# Patient Record
Sex: Male | Born: 1935 | Race: White | Hispanic: No | Marital: Single | Smoking: Former smoker
Health system: Southern US, Community
[De-identification: ages and names within clinical notes are randomized; demographics above are authoritative.]

## PROBLEM LIST (undated history)

## (undated) DIAGNOSIS — M1990 Unspecified osteoarthritis, unspecified site: Secondary | ICD-10-CM

## (undated) DIAGNOSIS — Z9989 Dependence on other enabling machines and devices: Secondary | ICD-10-CM

## (undated) DIAGNOSIS — D099 Carcinoma in situ, unspecified: Secondary | ICD-10-CM

## (undated) DIAGNOSIS — Z9189 Other specified personal risk factors, not elsewhere classified: Secondary | ICD-10-CM

## (undated) DIAGNOSIS — D039 Melanoma in situ, unspecified: Secondary | ICD-10-CM

## (undated) DIAGNOSIS — D229 Melanocytic nevi, unspecified: Secondary | ICD-10-CM

## (undated) DIAGNOSIS — C4492 Squamous cell carcinoma of skin, unspecified: Secondary | ICD-10-CM

## (undated) DIAGNOSIS — R51 Headache: Secondary | ICD-10-CM

## (undated) DIAGNOSIS — N400 Enlarged prostate without lower urinary tract symptoms: Secondary | ICD-10-CM

## (undated) DIAGNOSIS — C4491 Basal cell carcinoma of skin, unspecified: Secondary | ICD-10-CM

## (undated) DIAGNOSIS — I4892 Unspecified atrial flutter: Secondary | ICD-10-CM

## (undated) DIAGNOSIS — Z87898 Personal history of other specified conditions: Secondary | ICD-10-CM

## (undated) DIAGNOSIS — R310 Gross hematuria: Secondary | ICD-10-CM

## (undated) DIAGNOSIS — I10 Essential (primary) hypertension: Secondary | ICD-10-CM

## (undated) DIAGNOSIS — Z85828 Personal history of other malignant neoplasm of skin: Secondary | ICD-10-CM

## (undated) DIAGNOSIS — K219 Gastro-esophageal reflux disease without esophagitis: Secondary | ICD-10-CM

## (undated) HISTORY — DX: Carcinoma in situ, unspecified: D09.9

## (undated) HISTORY — DX: Basal cell carcinoma of skin, unspecified: C44.91

## (undated) HISTORY — PX: PILONIDAL CYST EXCISION: SHX744

## (undated) HISTORY — DX: Squamous cell carcinoma of skin, unspecified: C44.92

## (undated) HISTORY — DX: Melanoma in situ, unspecified: D03.9

## (undated) HISTORY — DX: Essential (primary) hypertension: I10

## (undated) HISTORY — DX: Melanocytic nevi, unspecified: D22.9

## (undated) HISTORY — PX: TONSILLECTOMY: SUR1361

## (undated) HISTORY — DX: Unspecified osteoarthritis, unspecified site: M19.90

---

## 1992-05-17 HISTORY — PX: HERNIA REPAIR: SHX51

## 1998-06-02 ENCOUNTER — Encounter: Payer: Self-pay | Admitting: Emergency Medicine

## 1998-06-02 ENCOUNTER — Inpatient Hospital Stay (HOSPITAL_COMMUNITY): Admission: EM | Admit: 1998-06-02 | Discharge: 1998-06-03 | Payer: Self-pay | Admitting: Emergency Medicine

## 1998-06-03 ENCOUNTER — Encounter: Payer: Self-pay | Admitting: *Deleted

## 2000-08-31 DIAGNOSIS — D099 Carcinoma in situ, unspecified: Secondary | ICD-10-CM

## 2000-08-31 HISTORY — DX: Carcinoma in situ, unspecified: D09.9

## 2003-03-12 DIAGNOSIS — C4492 Squamous cell carcinoma of skin, unspecified: Secondary | ICD-10-CM

## 2003-03-12 HISTORY — DX: Squamous cell carcinoma of skin, unspecified: C44.92

## 2007-08-04 ENCOUNTER — Ambulatory Visit: Payer: Self-pay | Admitting: Internal Medicine

## 2007-08-21 ENCOUNTER — Ambulatory Visit: Payer: Self-pay | Admitting: Internal Medicine

## 2009-11-24 DIAGNOSIS — D039 Melanoma in situ, unspecified: Secondary | ICD-10-CM

## 2009-11-24 DIAGNOSIS — D229 Melanocytic nevi, unspecified: Secondary | ICD-10-CM

## 2009-11-24 DIAGNOSIS — D099 Carcinoma in situ, unspecified: Secondary | ICD-10-CM

## 2009-11-24 DIAGNOSIS — C4491 Basal cell carcinoma of skin, unspecified: Secondary | ICD-10-CM

## 2009-11-24 HISTORY — DX: Carcinoma in situ, unspecified: D09.9

## 2009-11-24 HISTORY — DX: Melanoma in situ, unspecified: D03.9

## 2009-11-24 HISTORY — DX: Melanocytic nevi, unspecified: D22.9

## 2009-11-24 HISTORY — DX: Basal cell carcinoma of skin, unspecified: C44.91

## 2010-09-29 NOTE — Assessment & Plan Note (Signed)
[facility]                         GASTROENTEROLOGY OFFICE NOTE   NAME:Stephen Choi, Stephen Choi                    MRN:          [id number]  DATE:08/04/2007                            DOB:          [birth year]    REFERRING PHYSICIAN:  Geoffry Paradise, M.D.   REASON FOR CONSULTATION:  Hemoccult positive stool.   HISTORY:  This is a pleasant 77 year old white male with a history of  benign prostatic hypertrophy and osteoarthritis.  He is referred through  the courtesy of Dr. Geoffry Paradise regarding Hemoccult positive stool.  The patient underwent his annual physical exam earlier this year.  Hemoglobin on May 31, 2007, was normal at 16.7.  Hemasure studies  submitted on June 26, 2007, positive.   The patient's GI review of systems is negative.  He denies heartburn,  dysphagia, abdominal pain, change in bowel habits, melena or  hematochezia.  He does use Diclofenac for his arthritis.  The patient  states he thinks he had a colonoscopy over five years ago, though is not  certain with whom or where.  No records found in this facility.   PAST MEDICAL HISTORY:  As above.   PAST SURGICAL HISTORY:  None.   ALLERGIES:  No known drug allergies.   CURRENT MEDICATIONS:  1. Flomax 0.4 mg daily.  2. Diclofenac 50 mg daily.   FAMILY HISTORY:  No family history of gastrointestinal malignancy.   SOCIAL HISTORY:  The patient is married with two children.  He has a  Master's degree.  He is retired Cabin [employer].  He does not smoke.  He does use  alcohol.   REVIEW OF SYSTEMS:  Per the diagnostic evaluation form.   PHYSICAL EXAMINATION:  GENERAL:  A well-appearing male, in no acute  distress.  VITAL SIGNS:  Blood pressure 140/78, heart rate 68, weight 223.4 pounds,  height 5 feet 10 inches.  HEENT:  Sclerae anicteric.  Conjunctivae pink.  Oral mucosa intact.  NECK:  No adenopathy.  LUNGS:  Clear.  HEART:  Regular.  ABDOMEN:  Soft without tenderness, mass or  hernia.  Good bowel sounds  heard.  EXTREMITIES:  Without edema.   IMPRESSION:  A 77 year old gentleman who presents regarding Hemoccult  positive stool.  Rule out colonic mucosal pathology such as neoplasia.  Rule out nonsteroidal anti-inflammatory drug-induced lesion.   RECOMMENDATIONS:  Colonoscopy and upper endoscopy.  The nature of the  procedure, as well as the risks, benefits and alternatives were  discussed in detail.  He understood and agreed to proceed.     Wilhemina Bonito. Marina Goodell, MD  Electronically Signed    JNP/MedQ  DD: 08/08/2007  DT: 08/08/2007  Job #: [id number]   cc:   Geoffry Paradise, M.D.

## 2011-05-18 HISTORY — PX: CATARACT EXTRACTION W/ INTRAOCULAR LENS  IMPLANT, BILATERAL: SHX1307

## 2011-06-14 DIAGNOSIS — D0439 Carcinoma in situ of skin of other parts of face: Secondary | ICD-10-CM | POA: Diagnosis not present

## 2011-07-05 DIAGNOSIS — E785 Hyperlipidemia, unspecified: Secondary | ICD-10-CM | POA: Diagnosis not present

## 2011-07-05 DIAGNOSIS — R7301 Impaired fasting glucose: Secondary | ICD-10-CM | POA: Diagnosis not present

## 2011-07-05 DIAGNOSIS — Z125 Encounter for screening for malignant neoplasm of prostate: Secondary | ICD-10-CM | POA: Diagnosis not present

## 2011-07-12 DIAGNOSIS — Z125 Encounter for screening for malignant neoplasm of prostate: Secondary | ICD-10-CM | POA: Diagnosis not present

## 2011-07-12 DIAGNOSIS — G43809 Other migraine, not intractable, without status migrainosus: Secondary | ICD-10-CM | POA: Diagnosis not present

## 2011-07-12 DIAGNOSIS — Z0000 Encounter for general adult medical examination without abnormal findings: Secondary | ICD-10-CM | POA: Diagnosis not present

## 2011-07-12 DIAGNOSIS — R7301 Impaired fasting glucose: Secondary | ICD-10-CM | POA: Diagnosis not present

## 2011-07-12 DIAGNOSIS — E785 Hyperlipidemia, unspecified: Secondary | ICD-10-CM | POA: Diagnosis not present

## 2011-07-13 DIAGNOSIS — I658 Occlusion and stenosis of other precerebral arteries: Secondary | ICD-10-CM | POA: Diagnosis not present

## 2011-07-13 DIAGNOSIS — R0989 Other specified symptoms and signs involving the circulatory and respiratory systems: Secondary | ICD-10-CM | POA: Diagnosis not present

## 2011-07-13 DIAGNOSIS — Z1212 Encounter for screening for malignant neoplasm of rectum: Secondary | ICD-10-CM | POA: Diagnosis not present

## 2011-09-20 DIAGNOSIS — L570 Actinic keratosis: Secondary | ICD-10-CM | POA: Diagnosis not present

## 2011-09-24 DIAGNOSIS — M1990 Unspecified osteoarthritis, unspecified site: Secondary | ICD-10-CM | POA: Diagnosis not present

## 2011-09-24 DIAGNOSIS — M5380 Other specified dorsopathies, site unspecified: Secondary | ICD-10-CM | POA: Diagnosis not present

## 2011-09-24 DIAGNOSIS — M4800 Spinal stenosis, site unspecified: Secondary | ICD-10-CM | POA: Diagnosis not present

## 2011-11-01 DIAGNOSIS — H04129 Dry eye syndrome of unspecified lacrimal gland: Secondary | ICD-10-CM | POA: Diagnosis not present

## 2011-11-01 DIAGNOSIS — H02839 Dermatochalasis of unspecified eye, unspecified eyelid: Secondary | ICD-10-CM | POA: Diagnosis not present

## 2011-11-01 DIAGNOSIS — L719 Rosacea, unspecified: Secondary | ICD-10-CM | POA: Diagnosis not present

## 2011-11-01 DIAGNOSIS — H40019 Open angle with borderline findings, low risk, unspecified eye: Secondary | ICD-10-CM | POA: Diagnosis not present

## 2011-12-09 DIAGNOSIS — L570 Actinic keratosis: Secondary | ICD-10-CM | POA: Diagnosis not present

## 2011-12-09 DIAGNOSIS — D485 Neoplasm of uncertain behavior of skin: Secondary | ICD-10-CM | POA: Diagnosis not present

## 2011-12-09 DIAGNOSIS — L723 Sebaceous cyst: Secondary | ICD-10-CM | POA: Diagnosis not present

## 2012-01-10 DIAGNOSIS — R7301 Impaired fasting glucose: Secondary | ICD-10-CM | POA: Diagnosis not present

## 2012-01-10 DIAGNOSIS — R5381 Other malaise: Secondary | ICD-10-CM | POA: Diagnosis not present

## 2012-01-10 DIAGNOSIS — R5383 Other fatigue: Secondary | ICD-10-CM | POA: Diagnosis not present

## 2012-01-10 DIAGNOSIS — E785 Hyperlipidemia, unspecified: Secondary | ICD-10-CM | POA: Diagnosis not present

## 2012-01-10 DIAGNOSIS — M4800 Spinal stenosis, site unspecified: Secondary | ICD-10-CM | POA: Diagnosis not present

## 2012-01-13 DIAGNOSIS — E785 Hyperlipidemia, unspecified: Secondary | ICD-10-CM | POA: Diagnosis not present

## 2012-01-21 DIAGNOSIS — R609 Edema, unspecified: Secondary | ICD-10-CM | POA: Diagnosis not present

## 2012-02-24 DIAGNOSIS — Z23 Encounter for immunization: Secondary | ICD-10-CM | POA: Diagnosis not present

## 2012-04-26 DIAGNOSIS — M4800 Spinal stenosis, site unspecified: Secondary | ICD-10-CM | POA: Diagnosis not present

## 2012-04-26 DIAGNOSIS — M5380 Other specified dorsopathies, site unspecified: Secondary | ICD-10-CM | POA: Diagnosis not present

## 2012-04-26 DIAGNOSIS — M1990 Unspecified osteoarthritis, unspecified site: Secondary | ICD-10-CM | POA: Diagnosis not present

## 2012-05-04 DIAGNOSIS — H40019 Open angle with borderline findings, low risk, unspecified eye: Secondary | ICD-10-CM | POA: Diagnosis not present

## 2012-05-04 DIAGNOSIS — H35039 Hypertensive retinopathy, unspecified eye: Secondary | ICD-10-CM | POA: Diagnosis not present

## 2012-05-04 DIAGNOSIS — H04129 Dry eye syndrome of unspecified lacrimal gland: Secondary | ICD-10-CM | POA: Diagnosis not present

## 2012-05-04 DIAGNOSIS — L719 Rosacea, unspecified: Secondary | ICD-10-CM | POA: Diagnosis not present

## 2012-05-04 DIAGNOSIS — H43399 Other vitreous opacities, unspecified eye: Secondary | ICD-10-CM | POA: Diagnosis not present

## 2012-05-04 DIAGNOSIS — Z961 Presence of intraocular lens: Secondary | ICD-10-CM | POA: Diagnosis not present

## 2012-05-17 DIAGNOSIS — I10 Essential (primary) hypertension: Secondary | ICD-10-CM

## 2012-05-17 HISTORY — DX: Essential (primary) hypertension: I10

## 2012-06-26 DIAGNOSIS — I10 Essential (primary) hypertension: Secondary | ICD-10-CM | POA: Diagnosis not present

## 2012-06-26 DIAGNOSIS — I4891 Unspecified atrial fibrillation: Secondary | ICD-10-CM | POA: Diagnosis not present

## 2012-06-26 DIAGNOSIS — M4800 Spinal stenosis, site unspecified: Secondary | ICD-10-CM | POA: Diagnosis not present

## 2012-06-26 DIAGNOSIS — E785 Hyperlipidemia, unspecified: Secondary | ICD-10-CM | POA: Diagnosis not present

## 2012-07-10 DIAGNOSIS — E785 Hyperlipidemia, unspecified: Secondary | ICD-10-CM | POA: Diagnosis not present

## 2012-07-10 DIAGNOSIS — I10 Essential (primary) hypertension: Secondary | ICD-10-CM | POA: Diagnosis not present

## 2012-07-10 DIAGNOSIS — R7301 Impaired fasting glucose: Secondary | ICD-10-CM | POA: Diagnosis not present

## 2012-07-10 DIAGNOSIS — Z125 Encounter for screening for malignant neoplasm of prostate: Secondary | ICD-10-CM | POA: Diagnosis not present

## 2012-07-11 DIAGNOSIS — M545 Low back pain: Secondary | ICD-10-CM | POA: Diagnosis not present

## 2012-07-12 ENCOUNTER — Ambulatory Visit (INDEPENDENT_AMBULATORY_CARE_PROVIDER_SITE_OTHER): Payer: Medicare Other | Admitting: Cardiovascular Disease

## 2012-07-12 ENCOUNTER — Encounter: Payer: Self-pay | Admitting: Cardiovascular Disease

## 2012-07-12 VITALS — BP 156/96 | HR 74 | Ht 70.0 in | Wt 225.8 lb

## 2012-07-12 DIAGNOSIS — I4892 Unspecified atrial flutter: Secondary | ICD-10-CM | POA: Diagnosis not present

## 2012-07-12 MED ORDER — WARFARIN SODIUM 2.5 MG PO TABS: ORAL_TABLET | ORAL | Status: DC

## 2012-07-12 MED ORDER — ASPIRIN 81 MG PO TABS: 81.0000 mg | ORAL_TABLET | Freq: Every day | ORAL | Status: DC

## 2012-07-12 NOTE — Assessment & Plan Note (Addendum)
Stephen Choi presents today with incidentally noted atrial flutter.  He has no symptoms of shortness of breath or chest pain. He is able to do all of his normal activities. He works out at [employer] on regular basis.  His rate is well controlled on the current dose of diltiazem. I think at all we need to do is at Coumadin. Since he has atrial flutter he's not a candidate for one of the newer agents. We'll go ahead and start him on Coumadin today. He should be check his INR levels at the [facility]. Alternatively, we can have his levels checked in our Coumadin clinic. I'll see him back in one year for followup visit and EKG.  At this point he's not having any episodes of chest pain or shortness breath. I do not think that his symptoms sound like ischemic heart disease. I do not think that he needs an exercise test at this time.

## 2012-07-12 NOTE — Patient Instructions (Addendum)
Your physician wants you to follow-up in: 1 year  You will receive a reminder letter in the mail two months in advance. If you don't receive a letter, please call our office to schedule the follow-up appointment.  Your physician has recommended you make the following change in your medication:   Start coumadin/ warfarin, 2.5 mg tablet, on Monday Wednesday and Friday take 5 mg (2 - 2.5 mg tablet) On Tuesday Thursday Saturday and Sunday take 2.5 mg.  You are to follow up in 1 week for lab draw. Sheryle Spray pharm d council pt completed. Reduce aspirin to 81 mg daily.

## 2012-07-12 NOTE — Progress Notes (Signed)
     Stephen Choi Date of Birth  12/24/1927       [facility]    [place] [address]  [address] [place], [place]  [address]   [place], [place]  [address] [phone]     [phone]   Fax  [phone]    Fax [phone]  Problem List: 1. Hypertension 2. Atrial Flutter 3. Elevated PSA - biopsy negative for cancer in the past.   History of Present Illness:  Stephen Choi is an 79 year old gentleman who is referred here for further evaluation of atrial flutter. He did not know that he had any heartbeat  irregularities.  He works out on a regular basis without symptoms.  He denies any chest pain or shortness of breath. He denies any syncope or presyncope.  He was diagnosed with hypertension several weeks ago and has just recently been started on some new medications.  No current outpatient prescriptions on file prior to visit.   No current facility-administered medications on file prior to visit.   Current Outpatient Prescriptions  Medication Sig Dispense Refill  . aspirin 325 MG tablet Take 325 mg by mouth daily.      . diclofenac (VOLTAREN) 75 MG EC tablet Take 75 mg by mouth 2 (two) times daily.      Marland Kitchen diltiazem (DILACOR XR) 120 MG 24 hr capsule Take 120 mg by mouth daily.      . Glucosamine-Chondroitin (GLUCOSAMINE CHONDR COMPLEX PO) Take by mouth daily.      . hydrochlorothiazide (HYDRODIURIL) 25 MG tablet Take 25 mg by mouth daily.      . methocarbamol (ROBAXIN) 500 MG tablet Take 500 mg by mouth 3 (three) times daily as needed.      . Saw Palmetto 450 MG CAPS Take 450 mg by mouth 2 (two) times daily.      . tamsulosin (FLOMAX) 0.4 MG CAPS Take 0.4 mg by mouth daily after supper.       No current facility-administered medications for this visit.      No Known Allergies  Past Medical History  Diagnosis Date  . Hypertension 2014  . Arthritis     Past Surgical History  Procedure Laterality Date  . Cystectomy     lower back  . Hernia repair      History  Smoking status  . Former Smoker  Smokeless tobacco  . Not on file    History  Alcohol Use  . Yes    History reviewed. No pertinent family history.  Reviw of Systems:  Reviewed in the HPI.  All other systems are negative.  Physical Exam: Blood pressure 156/96, pulse 74, height 5\' 10"  (1.778 m), weight 225 lb 12.8 oz (102.422 kg). General: Well developed, well nourished, in no acute distress.  Head: Normocephalic, atraumatic, sclera non-icteric, mucus membranes are moist,   Neck: Supple. Carotids are 2 + without bruits. No JVD   Lungs: Clear   Heart: RR, normal S1, S2  Abdomen: Soft, non-tender, non-distended with normal bowel sounds.  Msk:  Strength and tone are normal   Extremities: No clubbing or cyanosis. No edema.  Distal pedal pulses are 2+ and equal    Neuro: CN II - XII intact.  Alert and oriented X 3.   Psych:  Normal   ECG: Atrial flutter with a ventricular rate of 74. He has no ST or T wave changes.  Assessment / Plan:

## 2012-07-14 DIAGNOSIS — M545 Low back pain: Secondary | ICD-10-CM | POA: Diagnosis not present

## 2012-07-17 DIAGNOSIS — I10 Essential (primary) hypertension: Secondary | ICD-10-CM | POA: Diagnosis not present

## 2012-07-17 DIAGNOSIS — E785 Hyperlipidemia, unspecified: Secondary | ICD-10-CM | POA: Diagnosis not present

## 2012-07-17 DIAGNOSIS — Z0000 Encounter for general adult medical examination without abnormal findings: Secondary | ICD-10-CM | POA: Diagnosis not present

## 2012-07-17 DIAGNOSIS — I4891 Unspecified atrial fibrillation: Secondary | ICD-10-CM | POA: Diagnosis not present

## 2012-07-18 DIAGNOSIS — Z1212 Encounter for screening for malignant neoplasm of rectum: Secondary | ICD-10-CM | POA: Diagnosis not present

## 2012-07-19 DIAGNOSIS — M545 Low back pain: Secondary | ICD-10-CM | POA: Diagnosis not present

## 2012-07-19 DIAGNOSIS — I4891 Unspecified atrial fibrillation: Secondary | ICD-10-CM | POA: Diagnosis not present

## 2012-07-19 DIAGNOSIS — Z7901 Long term (current) use of anticoagulants: Secondary | ICD-10-CM | POA: Diagnosis not present

## 2012-07-24 DIAGNOSIS — L570 Actinic keratosis: Secondary | ICD-10-CM | POA: Diagnosis not present

## 2012-07-27 DIAGNOSIS — M545 Low back pain: Secondary | ICD-10-CM | POA: Diagnosis not present

## 2012-08-02 DIAGNOSIS — Z7901 Long term (current) use of anticoagulants: Secondary | ICD-10-CM | POA: Diagnosis not present

## 2012-08-02 DIAGNOSIS — I4891 Unspecified atrial fibrillation: Secondary | ICD-10-CM | POA: Diagnosis not present

## 2012-08-14 DIAGNOSIS — M48061 Spinal stenosis, lumbar region without neurogenic claudication: Secondary | ICD-10-CM | POA: Diagnosis not present

## 2012-08-15 DIAGNOSIS — Z7901 Long term (current) use of anticoagulants: Secondary | ICD-10-CM | POA: Diagnosis not present

## 2012-08-15 DIAGNOSIS — M545 Low back pain: Secondary | ICD-10-CM | POA: Diagnosis not present

## 2012-08-15 DIAGNOSIS — I4891 Unspecified atrial fibrillation: Secondary | ICD-10-CM | POA: Diagnosis not present

## 2012-08-17 DIAGNOSIS — M545 Low back pain: Secondary | ICD-10-CM | POA: Diagnosis not present

## 2012-08-19 DIAGNOSIS — M47817 Spondylosis without myelopathy or radiculopathy, lumbosacral region: Secondary | ICD-10-CM | POA: Diagnosis not present

## 2012-08-21 DIAGNOSIS — M48061 Spinal stenosis, lumbar region without neurogenic claudication: Secondary | ICD-10-CM | POA: Diagnosis not present

## 2012-08-25 ENCOUNTER — Telehealth: Payer: Self-pay | Admitting: Internal Medicine

## 2012-08-25 NOTE — Telephone Encounter (Signed)
Rec'd from [facility] forward 1 page to Dr.Perry 08/25/12

## 2012-08-28 ENCOUNTER — Other Ambulatory Visit: Payer: Self-pay | Admitting: Internal Medicine

## 2012-08-28 DIAGNOSIS — N281 Cyst of kidney, acquired: Secondary | ICD-10-CM

## 2012-08-28 DIAGNOSIS — M4800 Spinal stenosis, site unspecified: Secondary | ICD-10-CM | POA: Diagnosis not present

## 2012-08-28 DIAGNOSIS — Q619 Cystic kidney disease, unspecified: Secondary | ICD-10-CM | POA: Diagnosis not present

## 2012-08-29 ENCOUNTER — Ambulatory Visit
Admission: RE | Admit: 2012-08-29 | Discharge: 2012-08-29 | Disposition: A | Discharge status: Home / Self Care | Payer: Medicare Other | Source: Ambulatory Visit | Attending: Internal Medicine | Admitting: Internal Medicine

## 2012-08-29 DIAGNOSIS — N281 Cyst of kidney, acquired: Secondary | ICD-10-CM | POA: Diagnosis not present

## 2012-08-30 ENCOUNTER — Other Ambulatory Visit: Payer: Self-pay | Admitting: Internal Medicine

## 2012-08-30 DIAGNOSIS — I4891 Unspecified atrial fibrillation: Secondary | ICD-10-CM | POA: Diagnosis not present

## 2012-08-30 DIAGNOSIS — N281 Cyst of kidney, acquired: Secondary | ICD-10-CM

## 2012-08-30 DIAGNOSIS — Z7901 Long term (current) use of anticoagulants: Secondary | ICD-10-CM | POA: Diagnosis not present

## 2012-09-01 ENCOUNTER — Ambulatory Visit
Admission: RE | Admit: 2012-09-01 | Discharge: 2012-09-01 | Disposition: A | Discharge status: Home / Self Care | Payer: Medicare Other | Source: Ambulatory Visit | Attending: Internal Medicine | Admitting: Internal Medicine

## 2012-09-01 DIAGNOSIS — N200 Calculus of kidney: Secondary | ICD-10-CM | POA: Diagnosis not present

## 2012-09-01 DIAGNOSIS — N281 Cyst of kidney, acquired: Secondary | ICD-10-CM

## 2012-09-01 MED ORDER — IOHEXOL 300 MG/ML  SOLN
125.0000 mL | Freq: Once | INTRAMUSCULAR | Status: AC | PRN
  Administered 2012-09-01: 125 mL via INTRAVENOUS

## 2012-09-06 DIAGNOSIS — Z7901 Long term (current) use of anticoagulants: Secondary | ICD-10-CM | POA: Diagnosis not present

## 2012-09-06 DIAGNOSIS — I4891 Unspecified atrial fibrillation: Secondary | ICD-10-CM | POA: Diagnosis not present

## 2012-09-07 DIAGNOSIS — M48061 Spinal stenosis, lumbar region without neurogenic claudication: Secondary | ICD-10-CM | POA: Diagnosis not present

## 2012-09-28 DIAGNOSIS — I4891 Unspecified atrial fibrillation: Secondary | ICD-10-CM | POA: Diagnosis not present

## 2012-09-28 DIAGNOSIS — Z7901 Long term (current) use of anticoagulants: Secondary | ICD-10-CM | POA: Diagnosis not present

## 2012-10-02 DIAGNOSIS — M545 Low back pain: Secondary | ICD-10-CM | POA: Diagnosis not present

## 2012-10-02 DIAGNOSIS — M48061 Spinal stenosis, lumbar region without neurogenic claudication: Secondary | ICD-10-CM | POA: Diagnosis not present

## 2012-10-06 DIAGNOSIS — M545 Low back pain: Secondary | ICD-10-CM | POA: Diagnosis not present

## 2012-10-10 DIAGNOSIS — M48061 Spinal stenosis, lumbar region without neurogenic claudication: Secondary | ICD-10-CM | POA: Diagnosis not present

## 2012-10-11 DIAGNOSIS — M545 Low back pain: Secondary | ICD-10-CM | POA: Diagnosis not present

## 2012-10-13 DIAGNOSIS — M545 Low back pain: Secondary | ICD-10-CM | POA: Diagnosis not present

## 2012-10-16 DIAGNOSIS — M545 Low back pain: Secondary | ICD-10-CM | POA: Diagnosis not present

## 2012-10-18 DIAGNOSIS — M4800 Spinal stenosis, site unspecified: Secondary | ICD-10-CM | POA: Diagnosis not present

## 2012-10-18 DIAGNOSIS — I10 Essential (primary) hypertension: Secondary | ICD-10-CM | POA: Diagnosis not present

## 2012-10-18 DIAGNOSIS — I4891 Unspecified atrial fibrillation: Secondary | ICD-10-CM | POA: Diagnosis not present

## 2012-10-19 DIAGNOSIS — M545 Low back pain: Secondary | ICD-10-CM | POA: Diagnosis not present

## 2012-10-23 DIAGNOSIS — M545 Low back pain: Secondary | ICD-10-CM | POA: Diagnosis not present

## 2012-10-25 DIAGNOSIS — M545 Low back pain: Secondary | ICD-10-CM | POA: Diagnosis not present

## 2012-10-26 DIAGNOSIS — M48061 Spinal stenosis, lumbar region without neurogenic claudication: Secondary | ICD-10-CM | POA: Diagnosis not present

## 2012-10-26 DIAGNOSIS — IMO0002 Reserved for concepts with insufficient information to code with codable children: Secondary | ICD-10-CM | POA: Diagnosis not present

## 2012-10-30 DIAGNOSIS — M545 Low back pain: Secondary | ICD-10-CM | POA: Diagnosis not present

## 2012-11-02 DIAGNOSIS — M545 Low back pain: Secondary | ICD-10-CM | POA: Diagnosis not present

## 2012-11-06 DIAGNOSIS — M545 Low back pain: Secondary | ICD-10-CM | POA: Diagnosis not present

## 2012-11-09 DIAGNOSIS — M545 Low back pain: Secondary | ICD-10-CM | POA: Diagnosis not present

## 2012-11-13 DIAGNOSIS — M48061 Spinal stenosis, lumbar region without neurogenic claudication: Secondary | ICD-10-CM | POA: Diagnosis not present

## 2012-11-14 DIAGNOSIS — M545 Low back pain: Secondary | ICD-10-CM | POA: Diagnosis not present

## 2012-11-16 DIAGNOSIS — M545 Low back pain: Secondary | ICD-10-CM | POA: Diagnosis not present

## 2012-11-20 DIAGNOSIS — M545 Low back pain: Secondary | ICD-10-CM | POA: Diagnosis not present

## 2012-11-23 DIAGNOSIS — M545 Low back pain: Secondary | ICD-10-CM | POA: Diagnosis not present

## 2012-11-27 DIAGNOSIS — M545 Low back pain: Secondary | ICD-10-CM | POA: Diagnosis not present

## 2012-11-30 DIAGNOSIS — M545 Low back pain: Secondary | ICD-10-CM | POA: Diagnosis not present

## 2013-01-08 DIAGNOSIS — M48061 Spinal stenosis, lumbar region without neurogenic claudication: Secondary | ICD-10-CM | POA: Diagnosis not present

## 2013-01-19 DIAGNOSIS — M48061 Spinal stenosis, lumbar region without neurogenic claudication: Secondary | ICD-10-CM | POA: Diagnosis not present

## 2013-01-26 DIAGNOSIS — M48061 Spinal stenosis, lumbar region without neurogenic claudication: Secondary | ICD-10-CM | POA: Diagnosis not present

## 2013-01-29 ENCOUNTER — Other Ambulatory Visit: Payer: Self-pay | Admitting: Orthopedic Surgery

## 2013-01-31 ENCOUNTER — Encounter (HOSPITAL_COMMUNITY): Payer: Self-pay | Admitting: Pharmacy Technician

## 2013-02-05 ENCOUNTER — Encounter (HOSPITAL_COMMUNITY)
Admission: RE | Admit: 2013-02-05 | Discharge: 2013-02-05 | Disposition: A | Discharge status: Home / Self Care | Payer: Medicare Other | Source: Ambulatory Visit | Attending: Orthopedic Surgery | Admitting: Orthopedic Surgery

## 2013-02-05 ENCOUNTER — Encounter (HOSPITAL_COMMUNITY): Payer: Self-pay

## 2013-02-05 DIAGNOSIS — Z01811 Encounter for preprocedural respiratory examination: Secondary | ICD-10-CM | POA: Diagnosis not present

## 2013-02-05 DIAGNOSIS — M48062 Spinal stenosis, lumbar region with neurogenic claudication: Secondary | ICD-10-CM | POA: Diagnosis not present

## 2013-02-05 LAB — COMPREHENSIVE METABOLIC PANEL
ALT: 25 U/L (ref 0–53)
CO2: 26 mEq/L (ref 19–32)
Calcium: 9 mg/dL (ref 8.4–10.5)
Creatinine, Ser: 0.95 mg/dL (ref 0.50–1.35)
GFR calc Af Amer: 87 mL/min — ABNORMAL LOW (ref >90)
GFR calc non Af Amer: 75 mL/min — ABNORMAL LOW (ref >90)
Glucose, Bld: 98 mg/dL (ref 70–99)
Sodium: 138 mEq/L (ref 135–145)
Total Protein: 7.1 g/dL (ref 6.0–8.3)

## 2013-02-05 LAB — TYPE AND SCREEN
ABO/RH(D): A POS
Antibody Screen: NEGATIVE

## 2013-02-05 LAB — SURGICAL PCR SCREEN: MRSA, PCR: NEGATIVE

## 2013-02-05 LAB — CBC WITH DIFFERENTIAL/PLATELET
Eosinophils Absolute: 0.4 10*3/uL (ref 0.0–0.7)
Eosinophils Relative: 7 % — ABNORMAL HIGH (ref 0–5)
HCT: 45.4 % (ref 39.0–52.0)
Lymphocytes Relative: 18 % (ref 12–46)
Lymphs Abs: 1.2 10*3/uL (ref 0.7–4.0)
MCH: 37.9 pg — ABNORMAL HIGH (ref 26.0–34.0)
MCV: 106.1 fL — ABNORMAL HIGH (ref 78.0–100.0)
Monocytes Absolute: 0.9 10*3/uL (ref 0.1–1.0)
Platelets: 238 10*3/uL (ref 150–400)
RBC: 4.28 MIL/uL (ref 4.22–5.81)
RDW: 13.1 % (ref 11.5–15.5)

## 2013-02-05 LAB — URINALYSIS, ROUTINE W REFLEX MICROSCOPIC
Bilirubin Urine: NEGATIVE
Glucose, UA: NEGATIVE mg/dL
Hgb urine dipstick: NEGATIVE
Ketones, ur: NEGATIVE mg/dL
Protein, ur: NEGATIVE mg/dL
Urobilinogen, UA: 1 mg/dL (ref 0.0–1.0)

## 2013-02-05 LAB — PROTIME-INR
INR: 1.04 (ref 0.00–1.49)
Prothrombin Time: 13.4 seconds (ref 11.6–15.2)

## 2013-02-05 LAB — ABO/RH: ABO/RH(D): A POS

## 2013-02-05 NOTE — Progress Notes (Signed)
Patient had stopped all meds except saw palmetto and glucosamine.said he was instructed to do so. Told him to take his flomax, diltiazem ,and hctz today and tomorrow and take diltiazem and flomax am of surgery.

## 2013-02-05 NOTE — Pre-Procedure Instructions (Addendum)
Stephen Choi  02/05/2013   Your procedure is scheduled on:  02/07/13  Report to [place] SHORT STAY ADMITTING at 1000 AM.  Call this number if you have problems the morning of surgery: [phone]   Remember:   Do not eat food or drink liquids after midnight.   Take these medicines the morning of surgery with A SIP OF WATER: DILTIAZEM,FLOMAX                   STOP SAW PALMETTO, GLUCOSAMINE-CONDRONTIN,DICLFENAC  NOW       STOP XARELTO   PER DR   Do not wear jewelry, make-up or nail polish.  Do not wear lotions, powders, or perfumes. You may wear deodorant.  Do not shave 48 hours prior to surgery. Men may shave face and neck.  Do not bring valuables to the hospital.  [facility] is not responsible                   for any belongings or valuables.  Contacts, dentures or bridgework may not be worn into surgery.  Leave suitcase in the car. After surgery it may be brought to your room.  For patients admitted to the hospital, checkout time is 11:00 AM the day of  discharge.   Patients discharged the day of surgery will not be allowed to drive  home.  Name and phone number of your driver:   Special Instructions: Incentive Spirometry - Practice and bring it with you on the day of surgery. Shower using CHG 2 nights before surgery and the night before surgery.  If you shower the day of surgery use CHG.  Use special wash - you have one bottle of CHG for all showers.  You should use approximately 1/3 of the bottle for each shower.   Please read over the following fact sheets that you were given: Pain Booklet, Coughing and Deep Breathing, Blood Transfusion Information, MRSA Information and Surgical Site Infection Prevention

## 2013-02-06 ENCOUNTER — Encounter (HOSPITAL_COMMUNITY): Payer: Self-pay

## 2013-02-06 DIAGNOSIS — Z1331 Encounter for screening for depression: Secondary | ICD-10-CM | POA: Diagnosis not present

## 2013-02-06 DIAGNOSIS — Z6833 Body mass index (BMI) 33.0-33.9, adult: Secondary | ICD-10-CM | POA: Diagnosis not present

## 2013-02-06 DIAGNOSIS — M4800 Spinal stenosis, site unspecified: Secondary | ICD-10-CM | POA: Diagnosis not present

## 2013-02-06 DIAGNOSIS — R7301 Impaired fasting glucose: Secondary | ICD-10-CM | POA: Diagnosis not present

## 2013-02-06 DIAGNOSIS — I10 Essential (primary) hypertension: Secondary | ICD-10-CM | POA: Diagnosis not present

## 2013-02-06 DIAGNOSIS — Z23 Encounter for immunization: Secondary | ICD-10-CM | POA: Diagnosis not present

## 2013-02-06 DIAGNOSIS — I4891 Unspecified atrial fibrillation: Secondary | ICD-10-CM | POA: Diagnosis not present

## 2013-02-06 MED ORDER — CEFAZOLIN SODIUM-DEXTROSE 2-3 GM-% IV SOLR
2.0000 g | INTRAVENOUS | Status: DC
  Filled 2013-02-06: qty 50

## 2013-02-06 MED ORDER — POVIDONE-IODINE 7.5 % EX SOLN
Freq: Once | CUTANEOUS | Status: DC
  Filled 2013-02-06: qty 118

## 2013-02-06 NOTE — Progress Notes (Signed)
Anesthesia Chart Review:  Patient is a 79 year old male scheduled for L3-4 X-STOP on 02/07/13 by Dr. Yevette Edwards.  History includes former smoker, HTN, atrial flutter, arthritis, tonsillectomy, hernia repair. PCP is Dr. Jacky Kindle with [facility]. Dr. Jacky Kindle saw patient on 01/31/13 and cleared him for this procedure with instructions to hold Xarelto 2 days prior to surgery.  Of note, patient was seen by cardiologist Dr. Elease Hashimoto on 07/12/12 for evaluation of new onset aflutter.  He was asymptomatic and able to work-out on a regular basis so no additional testing was felt indicated at that time.  He remains asymptomatic.  EKG on 02/05/13 showed SR with marked sinus arrhythmia.  CXR on 02/05/13 showed no active cardiopulmonary disease.  Preoperative labs noted.  Patient had with aflutter and cardiology evaluation earlier this year.  No cardiac testing was ordered.  He is now on Xarelto which has been on hold since 02/04/13. His PCP has cleared him for surgery, so if no acute changes then I would anticipate that he could proceed as planned.  Anesthesiologist Dr. Katrinka Blazing agrees with this plan.  Velna Ochs [facility] Short Stay Center/Anesthesiology Phone [phone] 02/06/2013 2:19 PM

## 2013-02-07 ENCOUNTER — Encounter (HOSPITAL_COMMUNITY): Payer: Self-pay | Admitting: *Deleted

## 2013-02-07 ENCOUNTER — Ambulatory Visit (HOSPITAL_COMMUNITY)
Admission: RE | Admit: 2013-02-07 | Discharge: 2013-02-07 | Disposition: A | Discharge status: Home / Self Care | Payer: Medicare Other | Source: Ambulatory Visit | Attending: Orthopedic Surgery | Admitting: Orthopedic Surgery

## 2013-02-07 ENCOUNTER — Encounter (HOSPITAL_COMMUNITY)
Admission: RE | Disposition: A | Discharge status: Home / Self Care | Payer: Self-pay | Source: Ambulatory Visit | Attending: Orthopedic Surgery

## 2013-02-07 ENCOUNTER — Ambulatory Visit (HOSPITAL_COMMUNITY): Payer: Medicare Other

## 2013-02-07 ENCOUNTER — Ambulatory Visit (HOSPITAL_COMMUNITY): Payer: Medicare Other | Admitting: Anesthesiology

## 2013-02-07 ENCOUNTER — Encounter (HOSPITAL_COMMUNITY): Payer: Self-pay | Admitting: Vascular Surgery

## 2013-02-07 DIAGNOSIS — M48062 Spinal stenosis, lumbar region with neurogenic claudication: Secondary | ICD-10-CM | POA: Diagnosis not present

## 2013-02-07 DIAGNOSIS — I10 Essential (primary) hypertension: Secondary | ICD-10-CM | POA: Diagnosis not present

## 2013-02-07 DIAGNOSIS — M533 Sacrococcygeal disorders, not elsewhere classified: Secondary | ICD-10-CM | POA: Diagnosis not present

## 2013-02-07 DIAGNOSIS — M79609 Pain in unspecified limb: Secondary | ICD-10-CM | POA: Diagnosis not present

## 2013-02-07 HISTORY — PX: LUMBAR LAMINECTOMY: SHX95

## 2013-02-07 SURGERY — LUMBAR LAMINECTOMY WITH  X-STOP 1 LEVEL
Anesthesia: General | Site: Spine Lumbar | Wound class: Clean

## 2013-02-07 MED ORDER — 0.9 % SODIUM CHLORIDE (POUR BTL) OPTIME
TOPICAL | Status: DC | PRN
  Administered 2013-02-07: 1000 mL

## 2013-02-07 MED ORDER — OXYCODONE HCL 5 MG/5ML PO SOLN: 5.0000 mg | Freq: Once | ORAL | Status: DC | PRN

## 2013-02-07 MED ORDER — FENTANYL CITRATE 0.05 MG/ML IJ SOLN
INTRAMUSCULAR | Status: DC | PRN
  Administered 2013-02-07 (×2): 100 ug via INTRAVENOUS
  Administered 2013-02-07 (×3): 50 ug via INTRAVENOUS

## 2013-02-07 MED ORDER — PROMETHAZINE HCL 25 MG/ML IJ SOLN: 6.2500 mg | INTRAMUSCULAR | Status: DC | PRN

## 2013-02-07 MED ORDER — THROMBIN 20000 UNITS EX SOLR
CUTANEOUS | Status: AC
  Filled 2013-02-07: qty 20000

## 2013-02-07 MED ORDER — PHENYLEPHRINE HCL 10 MG/ML IJ SOLN
INTRAMUSCULAR | Status: DC | PRN
  Administered 2013-02-07 (×3): 80 ug via INTRAVENOUS

## 2013-02-07 MED ORDER — EPHEDRINE SULFATE 50 MG/ML IJ SOLN
INTRAMUSCULAR | Status: DC | PRN
  Administered 2013-02-07: 10 mg via INTRAVENOUS

## 2013-02-07 MED ORDER — LACTATED RINGERS IV SOLN
Freq: Once | INTRAVENOUS | Status: AC
  Administered 2013-02-07: 11:00:00 via INTRAVENOUS

## 2013-02-07 MED ORDER — NEOSTIGMINE METHYLSULFATE 1 MG/ML IJ SOLN
INTRAMUSCULAR | Status: DC | PRN
  Administered 2013-02-07: 3 mg via INTRAVENOUS

## 2013-02-07 MED ORDER — HYDROMORPHONE HCL PF 1 MG/ML IJ SOLN
0.2500 mg | INTRAMUSCULAR | Status: DC | PRN
Start: 2013-02-07 — End: 2013-02-07

## 2013-02-07 MED ORDER — OXYCODONE HCL 5 MG PO TABS: 5.0000 mg | ORAL_TABLET | Freq: Once | ORAL | Status: DC | PRN

## 2013-02-07 MED ORDER — ONDANSETRON HCL 4 MG/2ML IJ SOLN
INTRAMUSCULAR | Status: DC | PRN
  Administered 2013-02-07: 4 mg via INTRAVENOUS

## 2013-02-07 MED ORDER — BUPIVACAINE-EPINEPHRINE PF 0.25-1:200000 % IJ SOLN
INTRAMUSCULAR | Status: AC
  Filled 2013-02-07: qty 30

## 2013-02-07 MED ORDER — HYDROCODONE-ACETAMINOPHEN 5-325 MG PO TABS
ORAL_TABLET | ORAL | Status: AC
  Filled 2013-02-07: qty 2

## 2013-02-07 MED ORDER — LACTATED RINGERS IV SOLN
INTRAVENOUS | Status: DC | PRN
  Administered 2013-02-07: 15:00:00 via INTRAVENOUS

## 2013-02-07 MED ORDER — PROPOFOL 10 MG/ML IV BOLUS
INTRAVENOUS | Status: DC | PRN
  Administered 2013-02-07: 160 mg via INTRAVENOUS

## 2013-02-07 MED ORDER — LIDOCAINE HCL (CARDIAC) 20 MG/ML IV SOLN
INTRAVENOUS | Status: DC | PRN
  Administered 2013-02-07: 70 mg via INTRAVENOUS

## 2013-02-07 MED ORDER — OXYCODONE-ACETAMINOPHEN 5-325 MG PO TABS
ORAL_TABLET | ORAL | Status: AC
  Filled 2013-02-07: qty 2

## 2013-02-07 MED ORDER — BUPIVACAINE-EPINEPHRINE 0.25% -1:200000 IJ SOLN
INTRAMUSCULAR | Status: DC | PRN
  Administered 2013-02-07: 30 mL

## 2013-02-07 MED ORDER — GLYCOPYRROLATE 0.2 MG/ML IJ SOLN
INTRAMUSCULAR | Status: DC | PRN
  Administered 2013-02-07: 0.6 mg via INTRAVENOUS

## 2013-02-07 MED ORDER — OXYCODONE-ACETAMINOPHEN 5-325 MG PO TABS
2.0000 | ORAL_TABLET | ORAL | Status: AC
  Administered 2013-02-07: 2 via ORAL

## 2013-02-07 MED ORDER — ROCURONIUM BROMIDE 100 MG/10ML IV SOLN
INTRAVENOUS | Status: DC | PRN
  Administered 2013-02-07: 50 mg via INTRAVENOUS

## 2013-02-07 SURGICAL SUPPLY — 62 items
ADH SKN CLS APL DERMABOND .7 (GAUZE/BANDAGES/DRESSINGS)
APL SKNCLS STERI-STRIP NONHPOA (GAUZE/BANDAGES/DRESSINGS) ×1
BENZOIN TINCTURE PRP APPL 2/3 (GAUZE/BANDAGES/DRESSINGS) ×2 IMPLANT
BUR MATCHSTICK NEURO 3.0 LAGG (BURR) IMPLANT
CANISTER SUCTION 2500CC (MISCELLANEOUS) ×2 IMPLANT
CARTRIDGE OIL MAESTRO DRILL (MISCELLANEOUS) IMPLANT
CLOTH BEACON ORANGE TIMEOUT ST (SAFETY) ×2 IMPLANT
CLSR STERI-STRIP ANTIMIC 1/2X4 (GAUZE/BANDAGES/DRESSINGS) ×2 IMPLANT
DERMABOND ADVANCED (GAUZE/BANDAGES/DRESSINGS)
DERMABOND ADVANCED .7 DNX12 (GAUZE/BANDAGES/DRESSINGS) IMPLANT
DIFFUSER DRILL AIR PNEUMATIC (MISCELLANEOUS) IMPLANT
DRAPE C-ARM 42X72 X-RAY (DRAPES) ×2 IMPLANT
DRAPE POUCH INSTRU U-SHP 10X18 (DRAPES) ×2 IMPLANT
DRAPE SURG 17X11 SM STRL (DRAPES) ×2 IMPLANT
DRAPE SURG 17X23 STRL (DRAPES) ×8 IMPLANT
DURAPREP 26ML APPLICATOR (WOUND CARE) ×2 IMPLANT
ELECT BLADE 6.5 EXT (BLADE) IMPLANT
ELECT REM PT RETURN 9FT ADLT (ELECTROSURGICAL) ×2
ELECTRODE REM PT RTRN 9FT ADLT (ELECTROSURGICAL) ×1 IMPLANT
GLOVE BIO SURGEON STRL SZ7 (GLOVE) IMPLANT
GLOVE BIOGEL PI IND STRL 7.0 (GLOVE) IMPLANT
GLOVE BIOGEL PI IND STRL 8 (GLOVE) IMPLANT
GLOVE BIOGEL PI INDICATOR 7.0 (GLOVE)
GLOVE BIOGEL PI INDICATOR 8 (GLOVE)
GLOVE BIOGEL PI ORTHO PRO SZ7 (GLOVE) ×2
GLOVE PI ORTHO PRO STRL SZ7 (GLOVE) IMPLANT
GLOVE SKINSENSE NS SZ8.0 LF (GLOVE)
GLOVE SKINSENSE STRL SZ8.0 LF (GLOVE) ×2 IMPLANT
GLOVE SURG SS PI 7.0 STRL IVOR (GLOVE) ×2 IMPLANT
GOWN PREVENTION PLUS XXLARGE (GOWN DISPOSABLE) ×1 IMPLANT
GOWN STRL NON-REIN LRG LVL3 (GOWN DISPOSABLE) ×8 IMPLANT
IV CATH 14GX2 1/4 (CATHETERS) ×2 IMPLANT
KIT BASIN OR (CUSTOM PROCEDURE TRAY) ×2 IMPLANT
KIT ROOM TURNOVER OR (KITS) ×2 IMPLANT
NDL SPNL 18GX3.5 QUINCKE PK (NEEDLE) ×2 IMPLANT
NEEDLE SPNL 18GX3.5 QUINCKE PK (NEEDLE) ×4 IMPLANT
NEEDLE SPNL 20GX3.5 QUINCKE YW (NEEDLE) IMPLANT
NS IRRIG 1000ML POUR BTL (IV SOLUTION) ×2 IMPLANT
OIL CARTRIDGE MAESTRO DRILL (MISCELLANEOUS)
PACK LAMINECTOMY ORTHO (CUSTOM PROCEDURE TRAY) ×2 IMPLANT
PACK UNIVERSAL I (CUSTOM PROCEDURE TRAY) ×2 IMPLANT
PAD ARMBOARD 7.5X6 YLW CONV (MISCELLANEOUS) ×4 IMPLANT
SPONGE GAUZE 4X4 12PLY (GAUZE/BANDAGES/DRESSINGS) ×1 IMPLANT
SPONGE LAP 4X18 X RAY DECT (DISPOSABLE) ×4 IMPLANT
STRIP CLOSURE SKIN 1/2X4 (GAUZE/BANDAGES/DRESSINGS) IMPLANT
SURGIFLO TRUKIT (HEMOSTASIS) IMPLANT
SUT FIBERWIRE #2 38 T-5 BLUE (SUTURE)
SUT MNCRL AB 4-0 PS2 18 (SUTURE) IMPLANT
SUT VIC AB 0 CT1 27 (SUTURE) ×2
SUT VIC AB 0 CT1 27XBRD ANBCTR (SUTURE) ×1 IMPLANT
SUT VIC AB 1 CT1 18XCR BRD 8 (SUTURE) IMPLANT
SUT VIC AB 1 CT1 27 (SUTURE) ×2
SUT VIC AB 1 CT1 27XBRD ANTBC (SUTURE) ×1 IMPLANT
SUT VIC AB 1 CT1 8-18 (SUTURE) ×2
SUT VIC AB 2-0 CT1 18 (SUTURE) ×2 IMPLANT
SUTURE FIBERWR #2 38 T-5 BLUE (SUTURE) IMPLANT
SYR BULB IRRIGATION 50ML (SYRINGE) ×2 IMPLANT
TAPE CLOTH SURG 4X10 WHT LF (GAUZE/BANDAGES/DRESSINGS) ×2 IMPLANT
TOWEL OR 17X24 6PK STRL BLUE (TOWEL DISPOSABLE) ×2 IMPLANT
TOWEL OR 17X26 10 PK STRL BLUE (TOWEL DISPOSABLE) ×2 IMPLANT
WATER STERILE IRR 1000ML POUR (IV SOLUTION) ×2 IMPLANT
X STOP 10MM (Orthopedic Implant) ×2 IMPLANT

## 2013-02-07 NOTE — H&P (Signed)
PREOPERATIVE H&P  Chief Complaint: bilateral leg pain  HPI: Stephen Choi is a 79 y.o. male who presents with ongoing pain in b/l legs c/w neurogenic claudication. Failed all conservative care. 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  Past Medical History  Diagnosis Date  . Hypertension 2014  . Arthritis   . Dysrhythmia     atrial flutter 2/14   Past Surgical History  Procedure Laterality Date  . Cystectomy      lower back  . Hernia repair    . Tonsillectomy    . Eye surgery Bilateral 13   History   Social History  . Marital Status: Married    Spouse Name: N/A    Number of Children: N/A  . Years of Education: N/A   Social History Main Topics  . Smoking status: Former Smoker -- 1.00 packs/day for 8 years    Types: Cigarettes    Quit date: 02/06/1959  . Smokeless tobacco: Not on file  . Alcohol Use: 8.4 oz/week    7 Glasses of wine, 7 Shots of liquor per week  . Drug Use: No  . Sexual Activity: Not on file   Other Topics Concern  . Not on file   Social History Narrative  . No narrative on file   No family history on file. No Known Allergies Prior to Admission medications   Medication Sig Start Date End Date Taking? Authorizing Provider  aspirin 81 MG tablet Take 81 mg by mouth daily. 07/12/12  Yes Vesta Mixer, MD  diclofenac (VOLTAREN) 75 MG EC tablet Take 150 mg by mouth daily.    Yes Historical Provider, MD  diltiazem (DILACOR XR) 120 MG 24 hr capsule Take 120 mg by mouth daily.   Yes Historical Provider, MD  Glucosamine-Chondroitin (GLUCOSAMINE CHONDR COMPLEX PO) Take 2 tablets by mouth daily.    Yes Historical Provider, MD  hydrochlorothiazide (HYDRODIURIL) 25 MG tablet Take 25 mg by mouth daily.   Yes Historical Provider, MD  Rivaroxaban (XARELTO) 20 MG TABS tablet Take 20 mg by mouth daily.   Yes Historical Provider, MD  Saw Palmetto 450 MG CAPS Take 450 mg by mouth daily.    Yes Historical Provider, MD  tamsulosin (FLOMAX) 0.4 MG CAPS Take 0.4 mg by mouth  daily after supper.   Yes Historical Provider, MD     All other systems have been reviewed and were otherwise negative with the exception of those mentioned in the HPI and as above.  Physical Exam: There were no vitals filed for this visit.  General: Alert, no acute distress Cardiovascular: No pedal edema Respiratory: No cyanosis, no use of accessory musculature Skin: No lesions in the area of chief complaint Neurologic: Sensation intact distally Psychiatric: Patient is competent for consent with normal mood and affect Lymphatic: No axillary or cervical lymphadenopathy   Assessment/Plan: Bilateral leg pain Plan for Procedure(s): X-STOP Lumbar 3-4 (1 LEVEL)   Emilee Hero, MD 02/07/2013 8:33 AM

## 2013-02-07 NOTE — OR Nursing (Signed)
Ambulated 40 feet with standby assist of 1 perason with no difficulty

## 2013-02-07 NOTE — Transfer of Care (Signed)
Immediate Anesthesia Transfer of Care Note  Patient: Stephen Choi  Procedure(s) Performed: Procedure(s) with comments: LUMBAR LAMINECTOMY WITH  X-STOP Salli Real 3-4 X-STOP (1 LEVEL) (N/A) - Lumbar 3-4 X-STOP  Patient Location: PACU  Anesthesia Type:General  Level of Consciousness: awake, alert  and oriented  Airway & Oxygen Therapy: Patient Spontanous Breathing  Post-op Assessment: Report given to PACU RN and Post -op Vital signs reviewed and stable  Post vital signs: Reviewed and stable  Complications: No apparent anesthesia complications

## 2013-02-07 NOTE — Anesthesia Procedure Notes (Signed)
Procedure Name: Intubation Date/Time: 02/07/2013 3:14 PM Performed by: Whitman Hero Pre-anesthesia Checklist: Patient identified, Emergency Drugs available, Suction available, Patient being monitored and Timeout performed Patient Re-evaluated:Patient Re-evaluated prior to inductionOxygen Delivery Method: Circle system utilized Preoxygenation: Pre-oxygenation with 100% oxygen Intubation Type: IV induction Ventilation: Mask ventilation without difficulty and Oral airway inserted - appropriate to patient size Laryngoscope Size: Mac and 3 Grade View: Grade I Tube type: Oral Tube size: 7.5 mm Number of attempts: 1 Airway Equipment and Method: Stylet Placement Confirmation: ETT inserted through vocal cords under direct vision,  breath sounds checked- equal and bilateral and positive ETCO2 Secured at: 23 cm Tube secured with: Tape Dental Injury: Teeth and Oropharynx as per pre-operative assessment

## 2013-02-07 NOTE — Anesthesia Postprocedure Evaluation (Signed)
Anesthesia Post Note  Patient: Stephen Choi  Procedure(s) Performed: Procedure(s) (LRB): LUMBAR LAMINECTOMY WITH  X-STOP Salli Real 3-4 X-STOP (1 LEVEL) (N/A)  Anesthesia type: General  Patient location: PACU  Post pain: Pain level controlled and Adequate analgesia  Post assessment: Post-op Vital signs reviewed, Patient's Cardiovascular Status Stable, Respiratory Function Stable, Patent Airway and Pain level controlled  Last Vitals:  Filed Vitals:   02/07/13 1730  BP:   Pulse:   Temp: 36.6 C  Resp:     Post vital signs: Reviewed and stable  Level of consciousness: awake, alert  and oriented  Complications: No apparent anesthesia complications

## 2013-02-07 NOTE — Anesthesia Preprocedure Evaluation (Signed)
Anesthesia Evaluation  Patient identified by MRN, date of birth, ID band Patient awake    Reviewed: Allergy & Precautions, H&P , NPO status   History of Anesthesia Complications Negative for: history of anesthetic complications  Airway Mallampati: II  Neck ROM: Full    Dental  (+) Teeth Intact   Pulmonary neg pulmonary ROS,  breath sounds clear to auscultation        Cardiovascular hypertension, + dysrhythmias Atrial Fibrillation Rhythm:Irregular Rate:Normal     Neuro/Psych    GI/Hepatic negative GI ROS, Neg liver ROS,   Endo/Other    Renal/GU negative Renal ROS     Musculoskeletal  (+) Arthritis -,   Abdominal (+) + obese,   Peds  Hematology   Anesthesia Other Findings   Reproductive/Obstetrics                           Anesthesia Physical Anesthesia Plan  ASA: II  Anesthesia Plan: General   Post-op Pain Management:    Induction: Intravenous  Airway Management Planned: Oral ETT  Additional Equipment:   Intra-op Plan:   Post-operative Plan: Extubation in OR  Informed Consent: I have reviewed the patients History and Physical, chart, labs and discussed the procedure including the risks, benefits and alternatives for the proposed anesthesia with the patient or authorized representative who has indicated his/her understanding and acceptance.   Dental advisory given  Plan Discussed with: CRNA and Surgeon  Anesthesia Plan Comments:         Anesthesia Quick Evaluation

## 2013-02-08 ENCOUNTER — Emergency Department (HOSPITAL_COMMUNITY)
Admission: EM | Admit: 2013-02-08 | Discharge: 2013-02-08 | Disposition: A | Discharge status: Home / Self Care | Payer: Medicare Other | Attending: Emergency Medicine | Admitting: Emergency Medicine

## 2013-02-08 ENCOUNTER — Encounter (HOSPITAL_COMMUNITY): Payer: Self-pay | Admitting: Orthopedic Surgery

## 2013-02-08 DIAGNOSIS — I499 Cardiac arrhythmia, unspecified: Secondary | ICD-10-CM | POA: Insufficient documentation

## 2013-02-08 DIAGNOSIS — Z7982 Long term (current) use of aspirin: Secondary | ICD-10-CM | POA: Diagnosis not present

## 2013-02-08 DIAGNOSIS — Z7901 Long term (current) use of anticoagulants: Secondary | ICD-10-CM | POA: Diagnosis not present

## 2013-02-08 DIAGNOSIS — I739 Peripheral vascular disease, unspecified: Secondary | ICD-10-CM | POA: Insufficient documentation

## 2013-02-08 DIAGNOSIS — M549 Dorsalgia, unspecified: Secondary | ICD-10-CM | POA: Diagnosis not present

## 2013-02-08 DIAGNOSIS — I10 Essential (primary) hypertension: Secondary | ICD-10-CM | POA: Insufficient documentation

## 2013-02-08 DIAGNOSIS — M129 Arthropathy, unspecified: Secondary | ICD-10-CM | POA: Insufficient documentation

## 2013-02-08 DIAGNOSIS — Z9889 Other specified postprocedural states: Secondary | ICD-10-CM | POA: Diagnosis not present

## 2013-02-08 DIAGNOSIS — M4800 Spinal stenosis, site unspecified: Secondary | ICD-10-CM | POA: Insufficient documentation

## 2013-02-08 DIAGNOSIS — R339 Retention of urine, unspecified: Secondary | ICD-10-CM | POA: Diagnosis not present

## 2013-02-08 DIAGNOSIS — Z87891 Personal history of nicotine dependence: Secondary | ICD-10-CM | POA: Insufficient documentation

## 2013-02-08 DIAGNOSIS — Z79899 Other long term (current) drug therapy: Secondary | ICD-10-CM | POA: Insufficient documentation

## 2013-02-08 MED ORDER — DOCUSATE SODIUM 100 MG PO CAPS: 100.0000 mg | ORAL_CAPSULE | Freq: Two times a day (BID) | ORAL | Status: DC

## 2013-02-08 NOTE — ED Notes (Signed)
Pt had same back surgery yesterday, denies BM or urinary voidance since DC home. Pt reports stomach cramping, nausea and 8/10 abdominal pain. Denies any back pain.

## 2013-02-08 NOTE — ED Provider Notes (Signed)
CSN: [id number]     Arrival date & time 02/08/13  1536 History   First MD Initiated Contact with Patient 02/08/13 1552     Chief Complaint  Patient presents with  . Post-op Problem    HPI   Mr. Palmero underwent an outpatient back surgery yesterday. He had placement of an x-stop device.   Indication is history of spinal stenosis and neurogenic claudication.  He was discharged approximately 7:30 last evening. He is uncertain if he urinated in PACU. He is unable to urinate today. He has no abdominal distention pain and cramping discomfort. He has not had a bowel movement yet today.  Fever. No numbness weakness or tingling or extremity symptoms. Minimal pain at his incision site  Past Medical History  Diagnosis Date  . Hypertension 2014  . Arthritis   . Dysrhythmia     atrial flutter 2/14   Past Surgical History  Procedure Laterality Date  . Cystectomy      lower back  . Hernia repair    . Tonsillectomy    . Eye surgery Bilateral 13  . Lumbar laminectomy N/A 02/07/2013    Procedure: LUMBAR LAMINECTOMY WITH  X-STOP Salli Real 3-4 X-STOP (1 LEVEL);  Surgeon: Emilee Hero, MD;  Location: [facility] OR;  Service: Orthopedics;  Laterality: N/A;  Lumbar 3-4 X-STOP   History reviewed. No pertinent family history. History  Substance Use Topics  . Smoking status: Former Smoker -- 1.00 packs/day for 8 years    Types: Cigarettes    Quit date: 02/06/1959  . Smokeless tobacco: Not on file  . Alcohol Use: 8.4 oz/week    7 Glasses of wine, 7 Shots of liquor per week    Review of Systems  Constitutional: Negative for fever, chills, diaphoresis, appetite change and fatigue.  HENT: Negative for sore throat, mouth sores and trouble swallowing.   Eyes: Negative for visual disturbance.  Respiratory: Negative for cough, chest tightness, shortness of breath and wheezing.   Cardiovascular: Negative for chest pain.  Gastrointestinal: Negative for nausea, vomiting, abdominal pain, diarrhea and  abdominal distention.  Endocrine: Negative for polydipsia, polyphagia and polyuria.  Genitourinary: Positive for decreased urine volume and difficulty urinating. Negative for dysuria, frequency and hematuria.  Musculoskeletal: Positive for back pain. Negative for gait problem.  Skin: Negative for color change, pallor and rash.  Neurological: Negative for dizziness, syncope, light-headedness and headaches.  Hematological: Does not bruise/bleed easily.  Psychiatric/Behavioral: Negative for behavioral problems and confusion.    Allergies  Review of patient's allergies indicates no known allergies.  Home Medications   Current Outpatient Rx  Name  Route  Sig  Dispense  Refill  . aspirin EC 81 MG tablet   Oral   Take 81 mg by mouth daily.         . diazepam (VALIUM) 5 MG tablet   Oral   Take 5 mg by mouth every 6 (six) hours as needed (spasms).         . diclofenac (VOLTAREN) 75 MG EC tablet   Oral   Take 75 mg by mouth 2 (two) times daily.         Marland Kitchen diltiazem (DILACOR XR) 120 MG 24 hr capsule   Oral   Take 120 mg by mouth daily.         . Glucosamine-Chondroitin (GLUCOSAMINE CHONDR COMPLEX PO)   Oral   Take 2 tablets by mouth daily.          . hydrochlorothiazide (HYDRODIURIL) 25 MG tablet  Oral   Take 25 mg by mouth daily.         Marland Kitchen oxyCODONE-acetaminophen (PERCOCET/ROXICET) 5-325 MG per tablet   Oral   Take 1 tablet by mouth every 4 (four) hours as needed for pain.         . Rivaroxaban (XARELTO) 20 MG TABS tablet   Oral   Take 20 mg by mouth daily.         . Saw Palmetto 450 MG CAPS   Oral   Take 450 mg by mouth daily.          . tamsulosin (FLOMAX) 0.4 MG CAPS   Oral   Take 0.4 mg by mouth daily.          Marland Kitchen docusate sodium (COLACE) 100 MG capsule   Oral   Take 1 capsule (100 mg total) by mouth every 12 (twelve) hours.   30 capsule   0    BP 136/71  Pulse 66  Temp(Src) 98.9 F (37.2 C) (Oral)  Resp 20  SpO2 96% Physical Exam   Constitutional: He is oriented to person, place, and time. He appears well-developed and well-nourished. No distress.  HENT:  Head: Normocephalic.  Eyes: Conjunctivae are normal. Pupils are equal, round, and reactive to light. No scleral icterus.  Neck: Normal range of motion. Neck supple. No thyromegaly present.  Cardiovascular: Normal rate and regular rhythm.  Exam reveals no gallop and no friction rub.   No murmur heard. Pulmonary/Chest: Effort normal and breath sounds normal. No respiratory distress. He has no wheezes. He has no rales.  Abdominal: Soft. Bowel sounds are normal. He exhibits distension. There is tenderness. There is no rebound.  Suprapubic tenderness. Suprapubic fullness. No peritoneal irritation. Limited bedside ultrasound by myself shows distention or urinary bladder to within 3 fingerbreadths of the umbilicus.  Musculoskeletal: Normal range of motion.  Neurological: He is alert and oriented to person, place, and time.  Skin: Skin is warm and dry. No rash noted.  Psychiatric: He has a normal mood and affect. His behavior is normal.    ED Course  Procedures (including critical care time) Labs Review Labs Reviewed - No data to display Imaging Review Dg Lumbar Spine 2-3 Views  02/07/2013   CLINICAL DATA:  Lumbar fusion.  EXAM: LUMBAR SPINE - 2-3 VIEW; DG C-ARM 1-60 MIN  COMPARISON:  Abdominal pelvic CT 09/01/2012.  FINDINGS: Two lateral spot fluoroscopic images of the lower lumbar spine are submitted. Five lumbar type vertebral bodies are assumed. There is chronic disc space loss and endplate sclerosis at L2-3. These views demonstrate interspinous fusion at L3-4 within X-Stop device.  IMPRESSION: Intraoperative views following L3-4 interspinous fusion   Electronically Signed   By: Roxy Horseman   On: 02/07/2013 19:25   Dg C-arm 1-60 Min  02/07/2013   CLINICAL DATA:  Lumbar fusion.  EXAM: LUMBAR SPINE - 2-3 VIEW; DG C-ARM 1-60 MIN  COMPARISON:  Abdominal pelvic CT  09/01/2012.  FINDINGS: Two lateral spot fluoroscopic images of the lower lumbar spine are submitted. Five lumbar type vertebral bodies are assumed. There is chronic disc space loss and endplate sclerosis at L2-3. These views demonstrate interspinous fusion at L3-4 within X-Stop device.  IMPRESSION: Intraoperative views following L3-4 interspinous fusion   Electronically Signed   By: Roxy Horseman   On: 02/07/2013 19:25    MDM   1. Urinary retention    Catheter is placed in a difficulty. He drinks 1100 cc. Symptoms are completely resolved. We'll place  him on Colace as he taking his Percocet. Catheter removal 6-7 days. He can see primary care, his surgeon, or to the emergency room for removal.    Roney Marion, MD 02/08/13 [phone]

## 2013-02-08 NOTE — ED Notes (Signed)
Pt had same day back surgery here yesterday, since the surgery he has been unable to void or have a bowel movement. Pt can only "trickle urine." pt has been nauseated with stomach cramps all day.

## 2013-02-08 NOTE — Op Note (Signed)
NAME:  Stephen Choi, VERTZ NO.:  [id number]  MEDICAL RECORD NO.:  [id number]  LOCATION:  [facility]                         FACILITY:  [facility]  PHYSICIAN:  Estill Bamberg, MD      DATE OF BIRTH:  1928/04/08  DATE OF PROCEDURE:  02/07/2013 DATE OF DISCHARGE:  02/07/2013                              OPERATIVE REPORT   PREOPERATIVE DIAGNOSIS: 1. 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 2. Neurogenic claudication.  POSTOPERATIVE DIAGNOSIS: 1. 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 2. Neurogenic claudication.  PROCEDURE: 1. Implantation of X-STOP implant, L3-4. 2. Intraoperative use of fluoroscopy.  SURGEON:  Estill Bamberg, MD  ASSISTANT:  Jason Coop, PA-C  ANESTHESIA:  General endotracheal anesthesia.  COMPLICATIONS:  None.  DISPOSITION:  Stable.  ESTIMATED BLOOD LOSS:  Minimal.  INDICATIONS FOR PROCEDURE:  Briefly, Stephen Choi is a very pleasant 79- year-old male, who did initially present to me on January 19, 2013, with bilateral leg pain.  The patient states that his pain involved his bilateral buttocks, posterior thighs, and calves.  He did state that his pain was increased with walking and decreased with forward flexion.  The patient did have 7 months of physical therapy with no relief.  He did also have multiple epidural injections, and did continue to have pain. Given his ongoing and severe limitation, we did have a discussion regarding going forward with surgical intervention.  We did discuss the risks and benefits regarding proceeding with an X-STOP implant.  He did clearly understand the risks and limitations of the procedure.  Of particular note, the patient did understand the chance of lack of recurrence of this pain, as well as recurrence of his pain even if he were to initially get benefit from it, as well as fracture, migration of the implanted device, infection, nerve injury, bleeding.  He did understand the goal of surgery was not to alleviate his back  pain. After fully understanding of the risks and benefits of surgery, the patient did wish to proceed.  OPERATIVE DETAILS:  On February 07, 2013, the patient brought to Surgery and general endotracheal anesthesia was administered.  The patient was placed prone on a well-padded flat Jackson bed.  A Wilson frame was placed beneath the patient and was positioned, so as to increase the kyphosis across the lumbar spine.  The back was prepped and draped in the usual sterile fashion.  Time-out procedure was performed. I then made a midline incision overlying the L3-4 interspace.  I then incised the fascia, just off the midline on the right and the left side. The supraspinous ligament was maintained.  I then placed a smaller and a larger dilator through the L3-4 interspace.  Distraction was applied. Upon review of fluoroscopy, I was able to identify excellent distraction across the spinous processes was achievable.  I did feel that a 10-mm implant would be the most appropriate fit.  I then passed a 10 mm Peak X- STOP implant from the right to the left side.  On the left side, the other side of the device was secured to the implant.  I did apply compression across the fin to optimize the stability in implant and I did lock the device into position using a small  screw, per the manufacturer's recommendations.  Of note, upon comparing the preoperative and postoperative radiographs, I was able to identify excellent distraction across the L3-4 interspace.  I then copiously irrigated the wound.  The fascia was then closed using #1 Vicryl.  The subcutaneous layer was then closed using 2-0 Vicryl, and the skin was closed using 3-0 Monocryl.  Benzoin and Steri-Strips were applied followed by sterile dressing.  All instrument counts were correct at the termination of the procedure.  Of note, Jason Coop was my assistant throughout the entirety of the procedure, and did aide in essential retraction  and suctioning needed throughout the entirety of the surgery.     Estill Bamberg, MD     MD/MEDQ  D:  02/07/2013  T:  02/08/2013  Job:  [id number]  cc:   Loraine Leriche L. Vear Clock, M.D. Geoffry Paradise, M.D.

## 2013-02-08 NOTE — ED Notes (Signed)
Pt provided with instructions on catheter care and leg bag. Daughter at bedside. Pt demonstrated procedure. Follow up instructions reviewed

## 2013-02-14 ENCOUNTER — Encounter (HOSPITAL_COMMUNITY): Payer: Self-pay | Admitting: Emergency Medicine

## 2013-02-14 ENCOUNTER — Emergency Department (HOSPITAL_COMMUNITY)
Admission: EM | Admit: 2013-02-14 | Discharge: 2013-02-14 | Disposition: A | Discharge status: Home / Self Care | Payer: Medicare Other | Attending: Emergency Medicine | Admitting: Emergency Medicine

## 2013-02-14 DIAGNOSIS — Z791 Long term (current) use of non-steroidal anti-inflammatories (NSAID): Secondary | ICD-10-CM | POA: Diagnosis not present

## 2013-02-14 DIAGNOSIS — Z7901 Long term (current) use of anticoagulants: Secondary | ICD-10-CM | POA: Diagnosis not present

## 2013-02-14 DIAGNOSIS — M129 Arthropathy, unspecified: Secondary | ICD-10-CM | POA: Diagnosis not present

## 2013-02-14 DIAGNOSIS — Z9889 Other specified postprocedural states: Secondary | ICD-10-CM | POA: Insufficient documentation

## 2013-02-14 DIAGNOSIS — I4892 Unspecified atrial flutter: Secondary | ICD-10-CM | POA: Insufficient documentation

## 2013-02-14 DIAGNOSIS — R109 Unspecified abdominal pain: Secondary | ICD-10-CM | POA: Diagnosis not present

## 2013-02-14 DIAGNOSIS — I10 Essential (primary) hypertension: Secondary | ICD-10-CM | POA: Insufficient documentation

## 2013-02-14 DIAGNOSIS — Z87891 Personal history of nicotine dependence: Secondary | ICD-10-CM | POA: Insufficient documentation

## 2013-02-14 DIAGNOSIS — R300 Dysuria: Secondary | ICD-10-CM | POA: Diagnosis not present

## 2013-02-14 DIAGNOSIS — Z7982 Long term (current) use of aspirin: Secondary | ICD-10-CM | POA: Insufficient documentation

## 2013-02-14 DIAGNOSIS — R339 Retention of urine, unspecified: Secondary | ICD-10-CM | POA: Diagnosis not present

## 2013-02-14 LAB — POCT I-STAT, CHEM 8
BUN: 24 mg/dL — ABNORMAL HIGH (ref 6–23)
Creatinine, Ser: 1.4 mg/dL — ABNORMAL HIGH (ref 0.50–1.35)
Glucose, Bld: 129 mg/dL — ABNORMAL HIGH (ref 70–99)
Hemoglobin: 15.3 g/dL (ref 13.0–17.0)
Potassium: 3.9 mEq/L (ref 3.5–5.1)
TCO2: 26 mmol/L (ref 0–100)

## 2013-02-14 LAB — CBC
HCT: 41.9 % (ref 39.0–52.0)
Hemoglobin: 14.7 g/dL (ref 13.0–17.0)
MCH: 36.8 pg — ABNORMAL HIGH (ref 26.0–34.0)
MCHC: 35.1 g/dL (ref 30.0–36.0)
MCV: 105 fL — ABNORMAL HIGH (ref 78.0–100.0)
RDW: 12.6 % (ref 11.5–15.5)

## 2013-02-14 LAB — URINE MICROSCOPIC-ADD ON

## 2013-02-14 LAB — URINALYSIS, ROUTINE W REFLEX MICROSCOPIC
Bilirubin Urine: NEGATIVE
Glucose, UA: NEGATIVE mg/dL
Ketones, ur: NEGATIVE mg/dL
Protein, ur: NEGATIVE mg/dL
pH: 5.5 (ref 5.0–8.0)

## 2013-02-14 MED ORDER — CIPROFLOXACIN HCL 500 MG PO TABS
500.0000 mg | ORAL_TABLET | Freq: Once | ORAL | Status: AC
  Administered 2013-02-14: 500 mg via ORAL
  Filled 2013-02-14: qty 1

## 2013-02-14 MED ORDER — CIPROFLOXACIN HCL 500 MG PO TABS: 500.0000 mg | ORAL_TABLET | Freq: Two times a day (BID) | ORAL | Status: DC

## 2013-02-14 NOTE — ED Provider Notes (Signed)
CSN: [id number]     Arrival date & time 02/14/13  4098 History   First MD Initiated Contact with Patient 02/14/13 0507     Chief Complaint  Patient presents with  . Urinary Retention   (Consider location/radiation/quality/duration/timing/severity/associated sxs/prior Treatment) HPI Hx per PT - back surgery a week ago required a Foley catheter. I was removed yesterday and patient returns tonight with urinary retention. He has had progressive worsening suprapubic pain and discomfort. No hematuria. No fever or chills. No dysuria. No history of same. Pain pressure-like in quality and not radiating. Past Medical History  Diagnosis Date  . Hypertension 2014  . Arthritis   . Dysrhythmia     atrial flutter 2/14   Past Surgical History  Procedure Laterality Date  . Cystectomy      lower back  . Hernia repair    . Tonsillectomy    . Eye surgery Bilateral 13  . Lumbar laminectomy N/A 02/07/2013    Procedure: LUMBAR LAMINECTOMY WITH  X-STOP Salli Real 3-4 X-STOP (1 LEVEL);  Surgeon: Emilee Hero, MD;  Location: [facility] OR;  Service: Orthopedics;  Laterality: N/A;  Lumbar 3-4 X-STOP   History reviewed. No pertinent family history. History  Substance Use Topics  . Smoking status: Former Smoker -- 1.00 packs/day for 8 years    Types: Cigarettes    Quit date: 02/06/1959  . Smokeless tobacco: Not on file  . Alcohol Use: 8.4 oz/week    7 Glasses of wine, 7 Shots of liquor per week    Review of Systems  Constitutional: Negative for fever and chills.  HENT: Negative for neck pain and neck stiffness.   Respiratory: Negative for shortness of breath.   Cardiovascular: Negative for chest pain.  Gastrointestinal: Negative for abdominal pain.  Genitourinary: Positive for difficulty urinating. Negative for dysuria.  Musculoskeletal: Negative for back pain.  Skin: Negative for rash.  Neurological: Negative for headaches.  All other systems reviewed and are negative.    Allergies  Review of  patient's allergies indicates no known allergies.  Home Medications   Current Outpatient Rx  Name  Route  Sig  Dispense  Refill  . aspirin EC 81 MG tablet   Oral   Take 81 mg by mouth daily.         . diazepam (VALIUM) 5 MG tablet   Oral   Take 5 mg by mouth every 6 (six) hours as needed (spasms).         . diclofenac (VOLTAREN) 75 MG EC tablet   Oral   Take 75 mg by mouth 2 (two) times daily.         Marland Kitchen diltiazem (DILACOR XR) 120 MG 24 hr capsule   Oral   Take 120 mg by mouth daily.         Marland Kitchen docusate sodium (COLACE) 100 MG capsule   Oral   Take 100 mg by mouth 2 (two) times daily.         . Glucosamine-Chondroitin (GLUCOSAMINE CHONDR COMPLEX PO)   Oral   Take 2 tablets by mouth daily.          . hydrochlorothiazide (HYDRODIURIL) 25 MG tablet   Oral   Take 25 mg by mouth daily.         Marland Kitchen oxyCODONE-acetaminophen (PERCOCET/ROXICET) 5-325 MG per tablet   Oral   Take 1 tablet by mouth every 4 (four) hours as needed for pain.         . Rivaroxaban (XARELTO) 20 MG TABS  tablet   Oral   Take 20 mg by mouth daily.         . Saw Palmetto 450 MG CAPS   Oral   Take 450 mg by mouth daily.          . tamsulosin (FLOMAX) 0.4 MG CAPS   Oral   Take 0.4 mg by mouth daily.           BP 151/94  Pulse 87  Temp(Src) 98.5 F (36.9 C) (Oral)  Resp 18  Ht 5\' 10"  (1.778 m)  Wt 226 lb (102.513 kg)  BMI 32.43 kg/m2  SpO2 95% Physical Exam  Constitutional: He is oriented to person, place, and time. He appears well-developed and well-nourished.  HENT:  Head: Normocephalic and atraumatic.  Eyes: EOM are normal. Pupils are equal, round, and reactive to light.  Neck: Neck supple.  Cardiovascular: Normal rate, regular rhythm and intact distal pulses.   Pulmonary/Chest: Effort normal and breath sounds normal. No respiratory distress.  Abdominal: Soft. Bowel sounds are normal.  Suprapubic fullness and tenderness. No abdominal tenderness otherwise   Musculoskeletal: Normal range of motion. He exhibits no edema and no tenderness.  Neurological: He is alert and oriented to person, place, and time.  Skin: Skin is warm and dry.    ED Course  Procedures (including critical care time) Labs Review Labs Reviewed  URINALYSIS, ROUTINE W REFLEX MICROSCOPIC - Abnormal; Notable for the following:    APPearance CLOUDY (*)    Hgb urine dipstick LARGE (*)    Nitrite POSITIVE (*)    Leukocytes, UA MODERATE (*)    All other components within normal limits  CBC - Abnormal; Notable for the following:    RBC 3.99 (*)    MCV 105.0 (*)    MCH 36.8 (*)    All other components within normal limits  URINE MICROSCOPIC-ADD ON - Abnormal; Notable for the following:    Bacteria, UA MANY (*)    All other components within normal limits  POCT I-STAT, CHEM 8 - Abnormal; Notable for the following:    Sodium 133 (*)    BUN 24 (*)    Creatinine, Ser 1.40 (*)    Glucose, Bld 129 (*)    All other components within normal limits  URINE CULTURE   Foley catheter placed with overly urine urine output. Urine clear without any blood or blood clots.  Cipro provided. Plan discharge home with Foley leg bag and followup urology. Prescription for Cipro provided.  MDM  Dx: Urinary retention  Urinalysis reviewed. Labs reviewed. Improved with Foley Vital signs and nursing notes reviewed and considered  Sunnie Nielsen, MD 02/14/13 [phone]

## 2013-02-14 NOTE — ED Notes (Signed)
Pt reports that he has been having difficulty urinating since having his catheter removed yesterday.  Pt reports only scant amount of urine for the past 8 hours with abdominal distention.

## 2013-02-15 DIAGNOSIS — N401 Enlarged prostate with lower urinary tract symptoms: Secondary | ICD-10-CM | POA: Diagnosis not present

## 2013-02-15 DIAGNOSIS — N390 Urinary tract infection, site not specified: Secondary | ICD-10-CM | POA: Diagnosis not present

## 2013-02-15 DIAGNOSIS — R339 Retention of urine, unspecified: Secondary | ICD-10-CM | POA: Diagnosis not present

## 2013-02-15 DIAGNOSIS — R972 Elevated prostate specific antigen [PSA]: Secondary | ICD-10-CM | POA: Diagnosis not present

## 2013-02-16 LAB — URINE CULTURE: Colony Count: >=100000

## 2013-02-19 DIAGNOSIS — L820 Inflamed seborrheic keratosis: Secondary | ICD-10-CM | POA: Diagnosis not present

## 2013-02-19 DIAGNOSIS — L570 Actinic keratosis: Secondary | ICD-10-CM | POA: Diagnosis not present

## 2013-02-19 DIAGNOSIS — D485 Neoplasm of uncertain behavior of skin: Secondary | ICD-10-CM | POA: Diagnosis not present

## 2013-02-21 DIAGNOSIS — R339 Retention of urine, unspecified: Secondary | ICD-10-CM | POA: Diagnosis not present

## 2013-02-21 DIAGNOSIS — N401 Enlarged prostate with lower urinary tract symptoms: Secondary | ICD-10-CM | POA: Diagnosis not present

## 2013-02-22 DIAGNOSIS — R82998 Other abnormal findings in urine: Secondary | ICD-10-CM | POA: Diagnosis not present

## 2013-02-22 DIAGNOSIS — N401 Enlarged prostate with lower urinary tract symptoms: Secondary | ICD-10-CM | POA: Diagnosis not present

## 2013-02-22 DIAGNOSIS — R339 Retention of urine, unspecified: Secondary | ICD-10-CM | POA: Diagnosis not present

## 2013-03-08 DIAGNOSIS — R339 Retention of urine, unspecified: Secondary | ICD-10-CM | POA: Diagnosis not present

## 2013-03-08 DIAGNOSIS — N401 Enlarged prostate with lower urinary tract symptoms: Secondary | ICD-10-CM | POA: Diagnosis not present

## 2013-03-09 DIAGNOSIS — N401 Enlarged prostate with lower urinary tract symptoms: Secondary | ICD-10-CM | POA: Diagnosis not present

## 2013-03-13 ENCOUNTER — Other Ambulatory Visit: Payer: Self-pay | Admitting: Urology

## 2013-03-13 DIAGNOSIS — N401 Enlarged prostate with lower urinary tract symptoms: Secondary | ICD-10-CM | POA: Diagnosis not present

## 2013-03-13 DIAGNOSIS — R339 Retention of urine, unspecified: Secondary | ICD-10-CM | POA: Diagnosis not present

## 2013-03-13 NOTE — Progress Notes (Signed)
Need orders in EPIC.  Surgery scheduled for 03/23/13.  Thank You.  

## 2013-03-15 ENCOUNTER — Encounter (HOSPITAL_COMMUNITY): Payer: Self-pay | Admitting: Pharmacy Technician

## 2013-03-16 DIAGNOSIS — N139 Obstructive and reflux uropathy, unspecified: Secondary | ICD-10-CM | POA: Diagnosis not present

## 2013-03-16 DIAGNOSIS — N401 Enlarged prostate with lower urinary tract symptoms: Secondary | ICD-10-CM | POA: Diagnosis not present

## 2013-03-20 ENCOUNTER — Other Ambulatory Visit: Payer: Self-pay | Admitting: Urology

## 2013-03-20 ENCOUNTER — Encounter (HOSPITAL_COMMUNITY)
Admission: RE | Admit: 2013-03-20 | Discharge: 2013-03-20 | Disposition: A | Discharge status: Home / Self Care | Payer: Medicare Other | Source: Ambulatory Visit | Attending: Urology | Admitting: Urology

## 2013-03-20 ENCOUNTER — Encounter (HOSPITAL_COMMUNITY): Payer: Self-pay

## 2013-03-20 DIAGNOSIS — E669 Obesity, unspecified: Secondary | ICD-10-CM | POA: Diagnosis not present

## 2013-03-20 DIAGNOSIS — Z79899 Other long term (current) drug therapy: Secondary | ICD-10-CM | POA: Diagnosis not present

## 2013-03-20 DIAGNOSIS — I10 Essential (primary) hypertension: Secondary | ICD-10-CM | POA: Diagnosis not present

## 2013-03-20 DIAGNOSIS — N320 Bladder-neck obstruction: Secondary | ICD-10-CM | POA: Diagnosis not present

## 2013-03-20 DIAGNOSIS — Z01812 Encounter for preprocedural laboratory examination: Secondary | ICD-10-CM | POA: Diagnosis not present

## 2013-03-20 DIAGNOSIS — N4289 Other specified disorders of prostate: Secondary | ICD-10-CM | POA: Diagnosis not present

## 2013-03-20 DIAGNOSIS — N401 Enlarged prostate with lower urinary tract symptoms: Secondary | ICD-10-CM | POA: Diagnosis not present

## 2013-03-20 DIAGNOSIS — Z87891 Personal history of nicotine dependence: Secondary | ICD-10-CM | POA: Diagnosis not present

## 2013-03-20 DIAGNOSIS — K219 Gastro-esophageal reflux disease without esophagitis: Secondary | ICD-10-CM | POA: Diagnosis not present

## 2013-03-20 DIAGNOSIS — R339 Retention of urine, unspecified: Secondary | ICD-10-CM | POA: Diagnosis not present

## 2013-03-20 DIAGNOSIS — N210 Calculus in bladder: Secondary | ICD-10-CM | POA: Diagnosis not present

## 2013-03-20 DIAGNOSIS — Z7901 Long term (current) use of anticoagulants: Secondary | ICD-10-CM | POA: Diagnosis not present

## 2013-03-20 DIAGNOSIS — N411 Chronic prostatitis: Secondary | ICD-10-CM | POA: Diagnosis not present

## 2013-03-20 DIAGNOSIS — I4892 Unspecified atrial flutter: Secondary | ICD-10-CM | POA: Diagnosis not present

## 2013-03-20 HISTORY — DX: Gastro-esophageal reflux disease without esophagitis: K21.9

## 2013-03-20 HISTORY — DX: Benign prostatic hyperplasia without lower urinary tract symptoms: N40.0

## 2013-03-20 HISTORY — DX: Headache: R51

## 2013-03-20 LAB — BASIC METABOLIC PANEL
BUN: 17 mg/dL (ref 6–23)
CO2: 28 mEq/L (ref 19–32)
Calcium: 10 mg/dL (ref 8.4–10.5)
Chloride: 98 mEq/L (ref 96–112)
Creatinine, Ser: 1.01 mg/dL (ref 0.50–1.35)
GFR calc Af Amer: 77 mL/min — ABNORMAL LOW (ref >90)
GFR calc non Af Amer: 67 mL/min — ABNORMAL LOW (ref >90)
Glucose, Bld: 86 mg/dL (ref 70–99)
Potassium: 4.6 mEq/L (ref 3.5–5.1)
Sodium: 136 mEq/L (ref 135–145)

## 2013-03-20 LAB — CBC
HCT: 42.9 % (ref 39.0–52.0)
Hemoglobin: 14.9 g/dL (ref 13.0–17.0)
MCH: 35.8 pg — ABNORMAL HIGH (ref 26.0–34.0)
MCHC: 34.7 g/dL (ref 30.0–36.0)
MCV: 103.1 fL — ABNORMAL HIGH (ref 78.0–100.0)
Platelets: 245 10*3/uL (ref 150–400)
RBC: 4.16 MIL/uL — ABNORMAL LOW (ref 4.22–5.81)
RDW: 13.2 % (ref 11.5–15.5)
WBC: 7.8 10*3/uL (ref 4.0–10.5)

## 2013-03-20 NOTE — Patient Instructions (Signed)
20 Jessi Jessop  03/20/2013   Your procedure is scheduled on: 03/23/13  Report to [facility] at 6:30 AM.  Call this number if you have problems the morning of surgery [phone]   Remember:   Do not eat food or drink liquids After Midnight.     Take these medicines the morning of surgery with A SIP OF WATER: cardizem, diltiazem   Do not wear jewelry, make-up or nail polish.  Do not wear lotions, powders, or perfumes. You may wear deodorant.  Do not shave 48 hours prior to surgery. Men may shave face and neck.  Do not bring valuables to the hospital.  Contacts, dentures or bridgework may not be worn into surgery.  Leave suitcase in the car. After surgery it may be brought to your room.  For patients admitted to the hospital, checkout time is 11:00 AM the day of discharge.   Birdie Sons, RN  pre op nurse call if needed [phone]    FAILURE TO FOLLOW THESE INSTRUCTIONS MAY RESULT IN CANCELLATION OF YOUR SURGERY   Patient Signature: ___________________________________________

## 2013-03-20 NOTE — H&P (Signed)
History of Present Illness   BPH with outlet obstruction: He was treated for this as far back as 11/02 with Flomax. Urinary retention: He developed a URI after lumbar surgery on 02/07/13.  Elevated PSA: His PSA rose from 0.8-8.4 in 11/02. TRUS/BX 04/20/01: Prostate volume - 104.1 cc Pathology: Benign  Interval history: He is doing well overall.  He would like to get his catheter out as soon as possible.      Past Medical History Problems  1. History of  Arthritis V13.4 2. History of  Atrial Flutter 427.32 3. History of  Hypertension 401.9  Surgical History Problems  1. History of  Eye Surgery Bilateral 2. History of  Hernia Repair 3. History of  Laminectomy Lumbar 4. History of  Tonsillectomy  Current Meds 1. Aspir-81 81 MG Oral Tablet Delayed Release; Therapy: (Recorded:01Oct2014) to 2. Colace CAPS; Therapy: (Recorded:01Oct2014) to 3. Diclofenac Sodium 75 MG Oral Tablet Delayed Release; Therapy: (Recorded:01Oct2014)  to 4. Diltiazem HCl ER 120 MG Oral Capsule Extended Release 24 Hour; Therapy:  (Recorded:01Oct2014) to 5. Finasteride 5 MG Oral Tablet; Take 1 tablet by mouth every day; Therapy: 02Oct2014 to  (Evaluate:27Sep2015)  Requested for: 02Oct2014; Last Rx:02Oct2014 6. Glucosamine Chondr 1500 Complx Oral Capsule; Therapy: (Recorded:01Oct2014) to 7. Hydrochlorothiazide 25 MG Oral Tablet; Therapy: (Recorded:01Oct2014) to 8. Nitrofurantoin Monohyd Macro 100 MG Oral Capsule; TAKE 1 CAPSULE BID; Therapy:  14Oct2014 to (Evaluate:21Oct2014)  Requested for: 14Oct2014; Last Rx:14Oct2014 9. Saw Palmetto 450 MG Oral Capsule; Therapy: (Recorded:01Oct2014) to 10. Tamsulosin HCl 0.4 MG Oral Capsule; TAKE TWO (2) CAPSULES BY MOUTH AT   BEDTIME; Therapy: 02Oct2014 to (Evaluate:27Sep2015)  Requested for: 02Oct2014;   Last Rx:02Oct2014 11. Xarelto 20 MG Oral Tablet; Therapy: (Recorded:01Oct2014) to  Allergies Medication  1. No Known Drug Allergies  Family History Problems  1.  Paternal history of  Heart Disease V17.49 2. Maternal history of  Stroke Syndrome V17.1 3. Paternal history of  Viral Pneumonia  Social History Problems  1. Alcohol Use 7 servings of wine and 7 shots of liquor weekly 2. Former Smoker V15.82 smoked 1ppd x 9 years; quit smoking in 1963   Vitals Vital Signs BMI Calculated: 32.21 BSA Calculated: 2.2 Height: 5 ft 10 in Weight: 225 lb  Blood Pressure: 132 / 79 Heart Rate: 70  BMI Calculated: 32.35 BSA Calculated: 2.2 Height: 5 ft 10 in Weight: 226 lb   Physical Exam Constitutional: Well nourished and well developed . No acute distress.  ENT:. The ears and nose are normal in appearance.  Neck: The appearance of the neck is normal and no neck mass is present.  Pulmonary: No respiratory distress and normal respiratory rhythm and effort.  Cardiovascular: Heart rate and rhythm are normal . No peripheral edema.  Abdomen: The abdomen is soft and nontender. No masses are palpated. No CVA tenderness. No hernias are palpable. No hepatosplenomegaly noted.  Rectal: Rectal exam demonstrates normal sphincter tone, no tenderness and no masses. Estimated prostate size is 3+. The prostate has no nodularity and is not tender. The left seminal vesicle is nonpalpable. The right seminal vesicle is nonpalpable. The perineum is normal on inspection.  Genitourinary: Examination of the penis demonstrates an indwelling catheter, but no discharge, no masses, no lesions and a normal meatus. The scrotum is without lesions. The right epididymis is palpably normal and non-tender. The left epididymis is palpably normal and non-tender. The right testis is non-tender and without masses. The left testis is non-tender and without masses.  Lymphatics: The femoral and inguinal nodes  are not enlarged or tender.  Skin: Normal skin turgor, no visible rash and no visible skin lesions.  Neuro/Psych:. Mood and affect are appropriate.   Results/Data  Urodynamics 03/08/13:  Study was performed to evaluate urinary retention. His full urodynamic study was reviewed in its entirety and is noted in the chart.  CMG: He had a cystometric capacity of 525 cc with detrusor stability. Pressure-flow: He was able to generate a voluntary contraction and voided 61 cc with a maximum detrusor pressure of 36 cm H2O and a flow rate of 2 cc/second. Fluoroscopy: He has a trabeculated bladder with indentation by the prostate at the base.  Impression: While there was a detrusor contraction was slightly weak although adequate for spontaneous voiding if he did not have outlet resistance.  Since he's been on Flomax for some time it would appear he would benefit from outlet reduction surgery such as a TURP although he has a fairly large prostate that measured 104 cc back in 2002.     Assessment Assessed  1. Benign prostatic hyperplasia with urinary obstruction (600.01,599.69) 2. Urinary retention (788.20)      I went over the results of the urodynamic study today. We discussed each of the findings of the study and the normal/anticipated results as well as the significance of the results of this study in relation to the reported subjective symptoms.  It would appear that he would benefit from outlet reduction surgery.  I discussed all treatment options including suprapubic tube placement, continued Foley catheter, intermittent self-catheterization as well as transurethral resection of the prostate and suprapubic prostatectomy.  After reviewing all of these options as well as their pluses and minuses he has elected to proceed with transurethral resection of his prostate.  He understands the fact that I may have to perform a second resection due to the very large size of his prostate.  We went over the procedure in detail including its risks and complications, the anticipated hospital and postoperative hospital course and the probability of success.  He understands and has elected to proceed.  He has  been off of Xerelto in the past and we'll need to stop that prior to the surgery.  In addition I will check a urine culture and place him on appropriate antibiotics preoperatively.   Plan  1.  He will be scheduled for transurethral resection of his prostate. 2.  He will stop Xarelto 3 days prior to his scheduled surgery. 3.  I will perform a urine culture 1 week prior to the surgery and place him on antibiotics based on the culture result.

## 2013-03-20 NOTE — Progress Notes (Signed)
EKG 02/05/13 on EPIC, Chest x-ray 02/05/13 on EPIC, desire for natural death form on chart

## 2013-03-20 NOTE — Progress Notes (Signed)
03/20/13 1119  OBSTRUCTIVE SLEEP APNEA  Have you ever been diagnosed with sleep apnea through a sleep study? No  Do you snore loudly (loud enough to be heard through closed doors)?  1  Do you often feel tired, fatigued, or sleepy during the daytime? 0  Has anyone observed you stop breathing during your sleep? 0  Do you have, or are you being treated for high blood pressure? 1  BMI more than 35 kg/m2? 0  Age over 77 years old? 1  Neck circumference greater than 40 cm/18 inches? 1  Gender: 1  Obstructive Sleep Apnea Score 5  Score 4 or greater  Results sent to PCP

## 2013-03-22 ENCOUNTER — Other Ambulatory Visit: Payer: Self-pay

## 2013-03-22 MED ORDER — VANCOMYCIN HCL 10 G IV SOLR
1500.0000 mg | Freq: Once | INTRAVENOUS | Status: AC
  Administered 2013-03-23: 1500 mg via INTRAVENOUS
  Filled 2013-03-22: qty 1500

## 2013-03-23 ENCOUNTER — Encounter (HOSPITAL_COMMUNITY): Payer: Medicare Other | Admitting: Anesthesiology

## 2013-03-23 ENCOUNTER — Ambulatory Visit (HOSPITAL_COMMUNITY): Payer: Medicare Other | Admitting: Anesthesiology

## 2013-03-23 ENCOUNTER — Ambulatory Visit (HOSPITAL_COMMUNITY)
Admission: RE | Admit: 2013-03-23 | Discharge: 2013-03-24 | Disposition: A | Discharge status: Home / Self Care | Payer: Medicare Other | Source: Ambulatory Visit | Attending: Urology | Admitting: Urology

## 2013-03-23 ENCOUNTER — Encounter (HOSPITAL_COMMUNITY)
Admission: RE | Disposition: A | Discharge status: Home / Self Care | Payer: Self-pay | Source: Ambulatory Visit | Attending: Urology

## 2013-03-23 ENCOUNTER — Encounter (HOSPITAL_COMMUNITY): Payer: Self-pay | Admitting: *Deleted

## 2013-03-23 DIAGNOSIS — Z87891 Personal history of nicotine dependence: Secondary | ICD-10-CM | POA: Insufficient documentation

## 2013-03-23 DIAGNOSIS — N138 Other obstructive and reflux uropathy: Secondary | ICD-10-CM | POA: Insufficient documentation

## 2013-03-23 DIAGNOSIS — N400 Enlarged prostate without lower urinary tract symptoms: Secondary | ICD-10-CM | POA: Diagnosis not present

## 2013-03-23 DIAGNOSIS — K219 Gastro-esophageal reflux disease without esophagitis: Secondary | ICD-10-CM | POA: Insufficient documentation

## 2013-03-23 DIAGNOSIS — Z79899 Other long term (current) drug therapy: Secondary | ICD-10-CM | POA: Insufficient documentation

## 2013-03-23 DIAGNOSIS — R339 Retention of urine, unspecified: Secondary | ICD-10-CM | POA: Diagnosis not present

## 2013-03-23 DIAGNOSIS — Z01812 Encounter for preprocedural laboratory examination: Secondary | ICD-10-CM | POA: Insufficient documentation

## 2013-03-23 DIAGNOSIS — I10 Essential (primary) hypertension: Secondary | ICD-10-CM | POA: Diagnosis not present

## 2013-03-23 DIAGNOSIS — N4289 Other specified disorders of prostate: Secondary | ICD-10-CM | POA: Insufficient documentation

## 2013-03-23 DIAGNOSIS — N210 Calculus in bladder: Secondary | ICD-10-CM | POA: Insufficient documentation

## 2013-03-23 DIAGNOSIS — N401 Enlarged prostate with lower urinary tract symptoms: Secondary | ICD-10-CM | POA: Insufficient documentation

## 2013-03-23 DIAGNOSIS — N320 Bladder-neck obstruction: Secondary | ICD-10-CM | POA: Insufficient documentation

## 2013-03-23 DIAGNOSIS — I4892 Unspecified atrial flutter: Secondary | ICD-10-CM | POA: Insufficient documentation

## 2013-03-23 DIAGNOSIS — E669 Obesity, unspecified: Secondary | ICD-10-CM | POA: Insufficient documentation

## 2013-03-23 DIAGNOSIS — N411 Chronic prostatitis: Secondary | ICD-10-CM | POA: Insufficient documentation

## 2013-03-23 DIAGNOSIS — Z7901 Long term (current) use of anticoagulants: Secondary | ICD-10-CM | POA: Insufficient documentation

## 2013-03-23 HISTORY — PX: TRANSURETHRAL RESECTION OF PROSTATE: SHX73

## 2013-03-23 SURGERY — TRANSURETHRAL RESECTION OF THE PROSTATE WITH GYRUS INSTRUMENTS
Anesthesia: General | Wound class: Clean Contaminated

## 2013-03-23 MED ORDER — DEXTROSE 5 % IV SOLN
1.0000 g | INTRAVENOUS | Status: AC
  Administered 2013-03-23: 1 g via INTRAVENOUS
  Filled 2013-03-23: qty 1

## 2013-03-23 MED ORDER — ACETAMINOPHEN 325 MG PO TABS
650.0000 mg | ORAL_TABLET | ORAL | Status: DC | PRN
  Filled 2013-03-23: qty 2

## 2013-03-23 MED ORDER — FENTANYL CITRATE 0.05 MG/ML IJ SOLN
INTRAMUSCULAR | Status: DC | PRN
  Administered 2013-03-23 (×2): 50 ug via INTRAVENOUS

## 2013-03-23 MED ORDER — PROMETHAZINE HCL 25 MG/ML IJ SOLN: 6.2500 mg | INTRAMUSCULAR | Status: DC | PRN

## 2013-03-23 MED ORDER — BACITRACIN-NEOMYCIN-POLYMYXIN 400-5-5000 EX OINT: 1.0000 "application " | TOPICAL_OINTMENT | Freq: Three times a day (TID) | CUTANEOUS | Status: DC | PRN

## 2013-03-23 MED ORDER — ONDANSETRON HCL 4 MG/2ML IJ SOLN: 4.0000 mg | INTRAMUSCULAR | Status: DC | PRN

## 2013-03-23 MED ORDER — PROPOFOL 10 MG/ML IV BOLUS
INTRAVENOUS | Status: DC | PRN
  Administered 2013-03-23: 170 mg via INTRAVENOUS

## 2013-03-23 MED ORDER — OXYCODONE HCL 5 MG PO TABS: 5.0000 mg | ORAL_TABLET | Freq: Once | ORAL | Status: DC | PRN

## 2013-03-23 MED ORDER — 0.9 % SODIUM CHLORIDE (POUR BTL) OPTIME
TOPICAL | Status: DC | PRN
  Administered 2013-03-23: 1000 mL

## 2013-03-23 MED ORDER — DEXAMETHASONE SODIUM PHOSPHATE 10 MG/ML IJ SOLN
INTRAMUSCULAR | Status: DC | PRN
  Administered 2013-03-23: 10 mg via INTRAVENOUS

## 2013-03-23 MED ORDER — ONDANSETRON HCL 4 MG/2ML IJ SOLN
INTRAMUSCULAR | Status: DC | PRN
  Administered 2013-03-23: 4 mg via INTRAVENOUS

## 2013-03-23 MED ORDER — ZOLPIDEM TARTRATE 5 MG PO TABS: 5.0000 mg | ORAL_TABLET | Freq: Every evening | ORAL | Status: DC | PRN

## 2013-03-23 MED ORDER — MEPERIDINE HCL 50 MG/ML IJ SOLN: 6.2500 mg | INTRAMUSCULAR | Status: DC | PRN

## 2013-03-23 MED ORDER — HYDROCODONE-ACETAMINOPHEN 5-325 MG PO TABS
1.0000 | ORAL_TABLET | ORAL | Status: DC | PRN
  Administered 2013-03-23 – 2013-03-24 (×2): 1 via ORAL
  Filled 2013-03-23 (×2): qty 1

## 2013-03-23 MED ORDER — LIDOCAINE HCL (CARDIAC) 20 MG/ML IV SOLN
INTRAVENOUS | Status: DC | PRN
  Administered 2013-03-23: 100 mg via INTRAVENOUS

## 2013-03-23 MED ORDER — OXYCODONE HCL 5 MG/5ML PO SOLN: 5.0000 mg | Freq: Once | ORAL | Status: DC | PRN

## 2013-03-23 MED ORDER — BELLADONNA ALKALOIDS-OPIUM 16.2-60 MG RE SUPP
1.0000 | Freq: Four times a day (QID) | RECTAL | Status: DC | PRN
Start: 2013-03-23 — End: 2013-03-24

## 2013-03-23 MED ORDER — SODIUM CHLORIDE 0.9 % IV SOLN
INTRAVENOUS | Status: DC
  Administered 2013-03-23 (×2): via INTRAVENOUS

## 2013-03-23 MED ORDER — SODIUM CHLORIDE 0.9 % IR SOLN
Status: DC | PRN
  Administered 2013-03-23: 24000 mL

## 2013-03-23 MED ORDER — GENTAMICIN SULFATE 40 MG/ML IJ SOLN
7.0000 mg/kg | INTRAMUSCULAR | Status: DC
  Administered 2013-03-23: 600 mg via INTRAVENOUS
  Filled 2013-03-23 (×2): qty 15

## 2013-03-23 MED ORDER — HYDROMORPHONE HCL PF 1 MG/ML IJ SOLN
0.2500 mg | INTRAMUSCULAR | Status: DC | PRN
  Administered 2013-03-23: 0.5 mg via INTRAVENOUS
  Administered 2013-03-23 (×2): 0.25 mg via INTRAVENOUS

## 2013-03-23 MED ORDER — PHENYLEPHRINE HCL 10 MG/ML IJ SOLN
INTRAMUSCULAR | Status: DC | PRN
  Administered 2013-03-23 (×2): 40 ug via INTRAVENOUS

## 2013-03-23 MED ORDER — SODIUM CHLORIDE 0.9 % IV SOLN
1.0000 g | Freq: Four times a day (QID) | INTRAVENOUS | Status: DC
  Administered 2013-03-23 – 2013-03-24 (×4): 1 g via INTRAVENOUS
  Filled 2013-03-23 (×6): qty 1000

## 2013-03-23 MED ORDER — STERILE WATER FOR IRRIGATION IR SOLN
Status: DC | PRN
  Administered 2013-03-23: 500 mL

## 2013-03-23 MED ORDER — LACTATED RINGERS IV SOLN
INTRAVENOUS | Status: DC | PRN
  Administered 2013-03-23: 09:00:00 via INTRAVENOUS

## 2013-03-23 MED ORDER — HYDROMORPHONE HCL PF 1 MG/ML IJ SOLN
INTRAMUSCULAR | Status: AC
  Filled 2013-03-23: qty 1

## 2013-03-23 SURGICAL SUPPLY — 25 items
BAG URINE DRAINAGE (UROLOGICAL SUPPLIES) ×1 IMPLANT
BAG URO CATCHER STRL LF (DRAPE) ×2 IMPLANT
BLADE SURG 15 STRL LF DISP TIS (BLADE) IMPLANT
BLADE SURG 15 STRL SS (BLADE)
CATH FOLEY 3WAY 30CC 24FR (CATHETERS) ×2
CATH URTH STD 24FR FL 3W 2 (CATHETERS) ×1 IMPLANT
CLOTH BEACON ORANGE TIMEOUT ST (SAFETY) ×2 IMPLANT
DRAPE CAMERA CLOSED 9X96 (DRAPES) ×2 IMPLANT
ELECT LOOP MED HF 24F 12D CBL (CLIP) ×1 IMPLANT
ELECT REM PT RETURN 9FT ADLT (ELECTROSURGICAL)
ELECTRODE REM PT RTRN 9FT ADLT (ELECTROSURGICAL) ×1 IMPLANT
EVACUATOR MICROVAS BLADDER (UROLOGICAL SUPPLIES) ×2 IMPLANT
GLOVE BIOGEL M 8.0 STRL (GLOVE) ×2 IMPLANT
GOWN PREVENTION PLUS XLARGE (GOWN DISPOSABLE) IMPLANT
GOWN STRL REIN XL XLG (GOWN DISPOSABLE) ×6 IMPLANT
IV NS IRRIG 3000ML ARTHROMATIC (IV SOLUTION) IMPLANT
KIT ASPIRATION TUBING (SET/KITS/TRAYS/PACK) ×2 IMPLANT
LOOPS RESECTOSCOPE DISP (ELECTROSURGICAL) ×1 IMPLANT
MANIFOLD NEPTUNE II (INSTRUMENTS) ×2 IMPLANT
NS IRRIG 1000ML POUR BTL (IV SOLUTION) IMPLANT
PACK CYSTO (CUSTOM PROCEDURE TRAY) ×2 IMPLANT
SUT ETHILON 3 0 PS 1 (SUTURE) IMPLANT
SYR 30ML LL (SYRINGE) ×2 IMPLANT
TUBING CONNECTING 10 (TUBING) ×2 IMPLANT
WIRE COONS/BENSON .038X145CM (WIRE) IMPLANT

## 2013-03-23 NOTE — Anesthesia Postprocedure Evaluation (Signed)
Anesthesia Post Note  Patient: Stephen Choi  Procedure(s) Performed: Procedure(s) (LRB): TRANSURETHRAL RESECTION OF THE PROSTATE WITH GYRUS INSTRUMENTS (N/A)  Anesthesia type: General  Patient location: PACU  Post pain: Pain level controlled  Post assessment: Post-op Vital signs reviewed  Last Vitals: BP 150/85  Pulse 71  Temp(Src) 36.7 C (Oral)  Resp 12  SpO2 100%  Post vital signs: Reviewed  Level of consciousness: sedated  Complications: No apparent anesthesia complications

## 2013-03-23 NOTE — Interval H&P Note (Signed)
History and Physical Interval Note:  03/23/2013 8:54 AM  Stephen Choi  has presented today for surgery, with the diagnosis of BPH  The various methods of treatment have been discussed with the patient and family. After consideration of risks, benefits and other options for treatment, the patient has consented to  Procedure(s): TRANSURETHRAL RESECTION OF THE PROSTATE WITH GYRUS INSTRUMENTS (N/A) as a surgical intervention .  The patient's history has been reviewed, patient examined, no change in status, stable for surgery.  I have reviewed the patient's chart and labs.  Questions were answered to the patient's satisfaction.     Garnett Farm

## 2013-03-23 NOTE — Op Note (Signed)
PATIENT:  Stephen Choi  PRE-OPERATIVE DIAGNOSIS: 1. BPH with outlet obstruction 2. Urinary retention  POST-OPERATIVE DIAGNOSIS: 1. BPH with outlet obstruction. 2 urinary retention. 3. Bladder calculi  PROCEDURE:  Procedure(s): 1. TURP 2. Removal of bladder calculi.  SURGEON:  Surgeon(s): Garnett Farm  ANESTHESIA:   General  EBL:  200 mL  DRAINS: Urinary Catheter (24 Fr. three-way Foley)   SPECIMEN:  Source of Specimen:  Prostatic chips to pathology   Indication: Stephen Choi is an 79 year old male patient who has a history of outlet obstruction. This had been managed with alpha blockade therapy however he developed urinary retention after lumbar surgery in 9/14. He was placed on maximum medical management and he underwent voiding trials which were unsuccessful. Urodynamics revealed a an adequate spontaneous detrusor contraction without resistance. We discussed the treatment options. He has a very large prostate but he elected to proceed with transurethral resection of the prostate. He understands that this may require a staged procedure. He had a preoperative urine culture that was positive and he was treated according to sensitivities. He also received preoperative intravenous antibiotics based on the culture results as well.  Description of operation: The patient was taken to the operating room and administered general anesthesia. He was then placed on the table and moved to the dorsal lithotomy position after which his genitalia was sterilely prepped and draped. An official timeout was then performed.  The 26 French resectoscope with Timberlake obturator was then introduced into the bladder and the obturator was removed. The resectoscope element with 12 lens was then inserted and the bladder was fully and systematically inspected. Ureteral orifices were noted to be in the normal anatomic positions well away from the bladder neck. He had a high bladder neck and a markedly  elongated prostatic urethra with bilobar hypertrophy. I did find, on the floor of the bladder, several small bladder stones. They were of a size that I felt they could easily be irrigated out through the resectoscope sheath.  I first began by resecting the prostate at the bladder neck at the 6:00 position and resected the lateral lobes at the 5 and 7:00 position. I then resected the prostate from the bladder neck back to the mid prostate first on the left side in a counter clockwise fashion. I then resected the right lobe in a similar fashion. There was still a significant amount of prostatic tissue left and I therefore resected from the mid prostate on out to the level of the verumontanum with care being taken to remain proximal to the ureter at all times. I resected the remaining lateral lobes and then cauterized all bleeding points were identified. The Microvasive evacuator was used to evacuate all of the prostatic chips from the bladder. In addition the bladder calculi that were identified were also all removed by the use of the Microvasive evacuator. Reinspection of the bladder revealed the mucosa was intact throughout. Ureteral orifices were intact and well away from the bladder neck resection. The prostatic urethra appeared well resected and there was no evidence of active bleeding. I therefore filled the bladder, removed the resectoscope and then inserted the 24 French three-way Foley catheter and filled the balloon with 30 cc of water. Irrigation of the bladder revealed the irrigant returned clear. The catheter was connected to closed system drainage as well as CBI. The patient was then awakened and taken from the operating room to the recovery room in stable and satisfactory condition. He tolerated procedure well and there were  no intraoperative complications.    PATIENT DISPOSITION:  PACU - hemodynamically stable.

## 2013-03-23 NOTE — Preoperative (Signed)
Beta Blockers   Reason not to administer Beta Blockers:Not Applicable 

## 2013-03-23 NOTE — Progress Notes (Signed)
Patient ID: Tran Arzuaga, male   DOB: 02-21-1928, 79 y.o.   MRN: [id number] He was noted to be doing well with no complaints of suprapubic or abdominal pain.  Afebrile with stable vital signs Abdomen was soft and nontender. Foley catheter was draining slightly bloody urine with no clots on CBI.  He appears to be doing well postoperatively.  He had a large prostate but I was able to resect the majority of his gland and I feel he has a very good chance of spontaneous voiding when his catheter is removed in the morning.  We did discuss there is a slight chance that he may have difficulty and a second procedure may be necessary.  Plan: 1.  Continue CBI tonight. 2.  Check H&H in the morning. 3.  His catheter will be removed at 6 AM for a voiding trial. 4.  I anticipate discharge home in the morning. 5.  I have placed his prescriptions on his chart.

## 2013-03-23 NOTE — Anesthesia Preprocedure Evaluation (Addendum)
Anesthesia Evaluation  Patient identified by MRN, date of birth, ID band Patient awake    Reviewed: Allergy & Precautions, H&P , NPO status , Patient's Chart, lab work & pertinent test results  History of Anesthesia Complications Negative for: history of anesthetic complications  Airway Mallampati: II  Neck ROM: Full    Dental  (+) Teeth Intact and Dental Advisory Given   Pulmonary pneumonia -, resolved, former smoker,  breath sounds clear to auscultation        Cardiovascular hypertension, Pt. on medications + dysrhythmias Atrial Fibrillation Rhythm:Irregular Rate:Normal     Neuro/Psych  Headaches, negative psych ROS   GI/Hepatic Neg liver ROS, GERD-  Medicated,  Endo/Other  negative endocrine ROS  Renal/GU negative Renal ROS     Musculoskeletal  (+) Arthritis -,   Abdominal (+) + obese,   Peds  Hematology negative hematology ROS (+)   Anesthesia Other Findings   Reproductive/Obstetrics                          Anesthesia Physical  Anesthesia Plan  ASA: III  Anesthesia Plan: General   Post-op Pain Management:    Induction: Intravenous  Airway Management Planned: LMA  Additional Equipment:   Intra-op Plan:   Post-operative Plan: Extubation in OR  Informed Consent: I have reviewed the patients History and Physical, chart, labs and discussed the procedure including the risks, benefits and alternatives for the proposed anesthesia with the patient or authorized representative who has indicated his/her understanding and acceptance.   Dental advisory given  Plan Discussed with: CRNA  Anesthesia Plan Comments:       Anesthesia Quick Evaluation
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
## 2013-03-23 NOTE — Transfer of Care (Signed)
Immediate Anesthesia Transfer of Care Note  Patient: Stephen Choi  Procedure(s) Performed: Procedure(s): TRANSURETHRAL RESECTION OF THE PROSTATE WITH GYRUS INSTRUMENTS (N/A)  Patient Location: PACU  Anesthesia Type:General  Level of Consciousness: sedated  Airway & Oxygen Therapy: Patient Spontanous Breathing and Patient connected to face mask oxygen  Post-op Assessment: Report given to PACU RN and Post -op Vital signs reviewed and stable  Post vital signs: Reviewed and stable  Complications: No apparent anesthesia complications

## 2013-03-24 DIAGNOSIS — N320 Bladder-neck obstruction: Secondary | ICD-10-CM | POA: Diagnosis not present

## 2013-03-24 DIAGNOSIS — N4289 Other specified disorders of prostate: Secondary | ICD-10-CM | POA: Diagnosis not present

## 2013-03-24 DIAGNOSIS — N210 Calculus in bladder: Secondary | ICD-10-CM | POA: Diagnosis not present

## 2013-03-24 DIAGNOSIS — R339 Retention of urine, unspecified: Secondary | ICD-10-CM | POA: Diagnosis not present

## 2013-03-24 DIAGNOSIS — N401 Enlarged prostate with lower urinary tract symptoms: Secondary | ICD-10-CM | POA: Diagnosis not present

## 2013-03-24 DIAGNOSIS — N411 Chronic prostatitis: Secondary | ICD-10-CM | POA: Diagnosis not present

## 2013-03-24 LAB — GENTAMICIN LEVEL, RANDOM: Gentamicin Rm: 4 ug/mL

## 2013-03-24 LAB — HEMOGLOBIN AND HEMATOCRIT, BLOOD
HCT: 37.6 % — ABNORMAL LOW (ref 39.0–52.0)
Hemoglobin: 13.1 g/dL (ref 13.0–17.0)

## 2013-03-24 NOTE — Progress Notes (Signed)
1 Day Post-Op Subjective: Patient reports pain control good.  Objective: Vital signs in last 24 hours: Temp:  [97.5 F (36.4 C)-98.9 F (37.2 C)] 97.7 F (36.5 C) (11/08 0510) Pulse Rate:  [60-81] 65 (11/08 0510) Resp:  [8-20] 16 (11/08 0510) BP: (126-150)/(72-91) 137/76 mmHg (11/08 0510) SpO2:  [96 %-100 %] 96 % (11/08 0510) Weight:  [103 kg (227 lb 1.2 oz)] 103 kg (227 lb 1.2 oz) (11/07 1149)  Intake/Output from previous day: 11/07 0701 - 11/08 0700 In: 10573.3 [P.O.:360; I.V.:1913.3; IV Piggyback:100] Out: 7775 [Urine:7775] Intake/Output this shift: Total I/O In: 360 [P.O.:360] Out: -   Physical Exam:  General:alert, cooperative and no distress GI: soft Male genitalia: not done foley removed, no suprapubic pain   Lab Results:  Recent Labs  03/24/13 0200  HGB 13.1  HCT 37.6*   BMET No results found for this basename: NA, K, CL, CO2, GLUCOSE, BUN, CREATININE, CALCIUM,  in the last 72 hours No results found for this basename: LABPT, INR,  in the last 72 hours No results found for this basename: LABURIN,  in the last 72 hours Results for orders placed during the hospital encounter of 02/14/13  URINE CULTURE     Status: None   Collection Time    02/14/13  4:31 AM      Result Value Range Status   Specimen Description URINE, CATHETERIZED   Final   Special Requests NONE   Final   Culture  Setup Time     Final   Value: 02/14/2013 10:38     Performed at [facility] Count     Final   Value: >=100,000 COLONIES/ML     Performed at [facility]   Culture     Final   Value: Multiple bacterial morphotypes present, none predominant. Suggest appropriate recollection if clinically indicated.     Performed at [facility]   Report Status 02/16/2013 FINAL   Final    Studies/Results: No results found.  Assessment/Plan: 1 Day Post-Op Procedure(s) (LRB): TRANSURETHRAL RESECTION OF THE PROSTATE WITH GYRUS INSTRUMENTS (N/A)  hgb stable,  foley removed, unable to void after 5 hrs.    Bladder scanning, will give fair trial to void, replace foley if needed  Home noonish.   LOS: 1 day   Crist Fat 03/24/2013, 10:09 AM

## 2013-03-24 NOTE — Progress Notes (Signed)
Pt catheter removed as ordered @ 5am and string of bottles started. Patient tolerated well. Pt did very good during the night .  Urine remained light pink with very little clots. Pain meds were administered as ordered. Patient educated on the post TURP care. SRP, RN BSN
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
## 2013-03-24 NOTE — Progress Notes (Signed)
Pt left at 1330 this day with his daughter at his side.  Pt alert, oriented, and without c/o.  Discharge instructions/prescriptions given/explained with pt and daughter verbalizing understanding.   Followup appointments noted.  Pt left with Couda Catheter intact and secured with leg strap. Foley supplies given.

## 2013-03-24 NOTE — Discharge Summary (Signed)
Date of admission: 03/23/2013  Date of discharge: 03/24/2013  Admission diagnosis: Bladder outlet obstruction secondary to enlarged prostate  Discharge diagnosis: Urinary retention  Secondary diagnoses:  Patient Active Problem List   Diagnosis Date Noted  . Atrial flutter 07/12/2012    History and Physical: For full details, please see admission history and physical. Briefly, Stephen Choi is a 79 y.o. year old patient with urinary retention secondary to enlarged prostate. He presented for elective TURP procedure.  Hospital Course: Patient tolerated the procedure well.  He was then transferred to the floor after an uneventful PACU stay.  His hospital course was uncomplicated.  On POD#2 he had met discharge criteria: was eating a regular diet, was up and ambulating independently,  pain was well controlled,  and was ready to for discharge. Notably, the patient failed his voiding trial, and as a result 18 [place] coud-tipped catheter was replaced. The patient was given catheter instructions and told to call on Monday morning for an appointment for a voiding trial in the office.   Laboratory values:   Recent Labs  03/24/13 0200  HGB 13.1  HCT 37.6*   No results found for this basename: NA, K, CL, CO2, GLUCOSE, BUN, CREATININE, CALCIUM,  in the last 72 hours No results found for this basename: LABPT, INR,  in the last 72 hours No results found for this basename: LABURIN,  in the last 72 hours Results for orders placed during the hospital encounter of 02/14/13  URINE CULTURE     Status: None   Collection Time    02/14/13  4:31 AM      Result Value Range Status   Specimen Description URINE, CATHETERIZED   Final   Special Requests NONE   Final   Culture  Setup Time     Final   Value: 02/14/2013 10:38     Performed at [facility] Count     Final   Value: >=100,000 COLONIES/ML     Performed at [facility]   Culture     Final   Value: Multiple bacterial  morphotypes present, none predominant. Suggest appropriate recollection if clinically indicated.     Performed at [facility]   Report Status 02/16/2013 FINAL   Final    Disposition: Home  Discharge instruction: The patient was instructed to be ambulatory but told to refrain from heavy lifting, strenuous activity, or driving.   Discharge medications:    Medication List    STOP taking these medications       finasteride 5 MG tablet  Commonly known as:  PROSCAR     tamsulosin 0.4 MG Caps capsule  Commonly known as:  FLOMAX      TAKE these medications       aspirin EC 81 MG tablet  Take 81 mg by mouth daily.     diazepam 5 MG tablet  Commonly known as:  VALIUM  Take 5 mg by mouth every 6 (six) hours as needed (spasms).     diclofenac 75 MG EC tablet  Commonly known as:  VOLTAREN  Take 75 mg by mouth daily.     diltiazem 120 MG 24 hr capsule  Commonly known as:  DILACOR XR  Take 120 mg by mouth daily.     diltiazem 120 MG tablet  Commonly known as:  CARDIZEM  Take 120 mg by mouth 4 (four) times daily.     GLUCOSAMINE CHONDR COMPLEX PO  Take 2 tablets by mouth daily.  hydrochlorothiazide 25 MG tablet  Commonly known as:  HYDRODIURIL  Take 25 mg by mouth daily.     oxyCODONE-acetaminophen 5-325 MG per tablet  Commonly known as:  PERCOCET/ROXICET  Take 1 tablet by mouth every 4 (four) hours as needed for pain.     Saw Palmetto 450 MG Caps  Take 450 mg by mouth daily.     XARELTO 20 MG Tabs tablet  Generic drug:  Rivaroxaban  Take 20 mg by mouth daily.        Followup:      Follow-up Information   Follow up with Garnett Farm, MD On 03/30/2013. (at 11:00)    Specialty:  Urology   Contact information:   [address] [place] [address] [phone]       Follow up with Garnett Farm, MD On 03/30/2013. (at 11:00)    Specialty:  Urology   Contact information:   [address] [place] [address] [phone]

## 2013-03-24 NOTE — Progress Notes (Signed)
Bladder scan revealed >686 at this time--catheter to be put back in (MD ordered 73F couda).

## 2013-03-26 ENCOUNTER — Encounter (HOSPITAL_COMMUNITY): Payer: Self-pay | Admitting: Urology

## 2013-03-27 DIAGNOSIS — R339 Retention of urine, unspecified: Secondary | ICD-10-CM | POA: Diagnosis not present

## 2013-03-30 DIAGNOSIS — N390 Urinary tract infection, site not specified: Secondary | ICD-10-CM | POA: Diagnosis not present

## 2013-03-30 DIAGNOSIS — N401 Enlarged prostate with lower urinary tract symptoms: Secondary | ICD-10-CM | POA: Diagnosis not present

## 2013-04-03 DIAGNOSIS — R262 Difficulty in walking, not elsewhere classified: Secondary | ICD-10-CM | POA: Diagnosis not present

## 2013-04-03 DIAGNOSIS — M6281 Muscle weakness (generalized): Secondary | ICD-10-CM | POA: Diagnosis not present

## 2013-04-05 DIAGNOSIS — R262 Difficulty in walking, not elsewhere classified: Secondary | ICD-10-CM | POA: Diagnosis not present

## 2013-04-05 DIAGNOSIS — M6281 Muscle weakness (generalized): Secondary | ICD-10-CM | POA: Diagnosis not present

## 2013-04-11 DIAGNOSIS — R262 Difficulty in walking, not elsewhere classified: Secondary | ICD-10-CM | POA: Diagnosis not present

## 2013-04-11 DIAGNOSIS — M6281 Muscle weakness (generalized): Secondary | ICD-10-CM | POA: Diagnosis not present

## 2013-04-11 DIAGNOSIS — R350 Frequency of micturition: Secondary | ICD-10-CM | POA: Diagnosis not present

## 2013-04-13 DIAGNOSIS — R262 Difficulty in walking, not elsewhere classified: Secondary | ICD-10-CM | POA: Diagnosis not present

## 2013-04-13 DIAGNOSIS — M6281 Muscle weakness (generalized): Secondary | ICD-10-CM | POA: Diagnosis not present

## 2013-04-17 DIAGNOSIS — R262 Difficulty in walking, not elsewhere classified: Secondary | ICD-10-CM | POA: Diagnosis not present

## 2013-04-17 DIAGNOSIS — M6281 Muscle weakness (generalized): Secondary | ICD-10-CM | POA: Diagnosis not present

## 2013-04-20 DIAGNOSIS — R262 Difficulty in walking, not elsewhere classified: Secondary | ICD-10-CM | POA: Diagnosis not present

## 2013-04-20 DIAGNOSIS — M6281 Muscle weakness (generalized): Secondary | ICD-10-CM | POA: Diagnosis not present

## 2013-04-24 DIAGNOSIS — M6281 Muscle weakness (generalized): Secondary | ICD-10-CM | POA: Diagnosis not present

## 2013-04-24 DIAGNOSIS — R262 Difficulty in walking, not elsewhere classified: Secondary | ICD-10-CM | POA: Diagnosis not present

## 2013-04-27 DIAGNOSIS — M6281 Muscle weakness (generalized): Secondary | ICD-10-CM | POA: Diagnosis not present

## 2013-04-27 DIAGNOSIS — R262 Difficulty in walking, not elsewhere classified: Secondary | ICD-10-CM | POA: Diagnosis not present

## 2013-05-01 DIAGNOSIS — R262 Difficulty in walking, not elsewhere classified: Secondary | ICD-10-CM | POA: Diagnosis not present

## 2013-05-01 DIAGNOSIS — M6281 Muscle weakness (generalized): Secondary | ICD-10-CM | POA: Diagnosis not present

## 2013-05-04 DIAGNOSIS — M6281 Muscle weakness (generalized): Secondary | ICD-10-CM | POA: Diagnosis not present

## 2013-05-04 DIAGNOSIS — R262 Difficulty in walking, not elsewhere classified: Secondary | ICD-10-CM | POA: Diagnosis not present

## 2013-05-22 DIAGNOSIS — R262 Difficulty in walking, not elsewhere classified: Secondary | ICD-10-CM | POA: Diagnosis not present

## 2013-05-22 DIAGNOSIS — M6281 Muscle weakness (generalized): Secondary | ICD-10-CM | POA: Diagnosis not present

## 2013-05-25 DIAGNOSIS — R262 Difficulty in walking, not elsewhere classified: Secondary | ICD-10-CM | POA: Diagnosis not present

## 2013-05-25 DIAGNOSIS — M6281 Muscle weakness (generalized): Secondary | ICD-10-CM | POA: Diagnosis not present

## 2013-05-29 DIAGNOSIS — M6281 Muscle weakness (generalized): Secondary | ICD-10-CM | POA: Diagnosis not present

## 2013-05-29 DIAGNOSIS — R262 Difficulty in walking, not elsewhere classified: Secondary | ICD-10-CM | POA: Diagnosis not present

## 2013-06-01 DIAGNOSIS — M6281 Muscle weakness (generalized): Secondary | ICD-10-CM | POA: Diagnosis not present

## 2013-06-01 DIAGNOSIS — R262 Difficulty in walking, not elsewhere classified: Secondary | ICD-10-CM | POA: Diagnosis not present

## 2013-06-05 DIAGNOSIS — M6281 Muscle weakness (generalized): Secondary | ICD-10-CM | POA: Diagnosis not present

## 2013-06-05 DIAGNOSIS — R262 Difficulty in walking, not elsewhere classified: Secondary | ICD-10-CM | POA: Diagnosis not present

## 2013-06-08 DIAGNOSIS — M6281 Muscle weakness (generalized): Secondary | ICD-10-CM | POA: Diagnosis not present

## 2013-06-08 DIAGNOSIS — R262 Difficulty in walking, not elsewhere classified: Secondary | ICD-10-CM | POA: Diagnosis not present

## 2013-07-16 DIAGNOSIS — E785 Hyperlipidemia, unspecified: Secondary | ICD-10-CM | POA: Diagnosis not present

## 2013-07-16 DIAGNOSIS — I10 Essential (primary) hypertension: Secondary | ICD-10-CM | POA: Diagnosis not present

## 2013-07-16 DIAGNOSIS — R7301 Impaired fasting glucose: Secondary | ICD-10-CM | POA: Diagnosis not present

## 2013-07-16 DIAGNOSIS — R809 Proteinuria, unspecified: Secondary | ICD-10-CM | POA: Diagnosis not present

## 2013-07-16 DIAGNOSIS — R82998 Other abnormal findings in urine: Secondary | ICD-10-CM | POA: Diagnosis not present

## 2013-07-16 DIAGNOSIS — I4891 Unspecified atrial fibrillation: Secondary | ICD-10-CM | POA: Diagnosis not present

## 2013-07-16 DIAGNOSIS — Z125 Encounter for screening for malignant neoplasm of prostate: Secondary | ICD-10-CM | POA: Diagnosis not present

## 2013-07-17 DIAGNOSIS — R310 Gross hematuria: Secondary | ICD-10-CM | POA: Diagnosis not present

## 2013-07-23 DIAGNOSIS — G43809 Other migraine, not intractable, without status migrainosus: Secondary | ICD-10-CM | POA: Diagnosis not present

## 2013-07-23 DIAGNOSIS — Z23 Encounter for immunization: Secondary | ICD-10-CM | POA: Diagnosis not present

## 2013-07-23 DIAGNOSIS — Z6833 Body mass index (BMI) 33.0-33.9, adult: Secondary | ICD-10-CM | POA: Diagnosis not present

## 2013-07-23 DIAGNOSIS — Z125 Encounter for screening for malignant neoplasm of prostate: Secondary | ICD-10-CM | POA: Diagnosis not present

## 2013-07-23 DIAGNOSIS — R7301 Impaired fasting glucose: Secondary | ICD-10-CM | POA: Diagnosis not present

## 2013-07-23 DIAGNOSIS — Z0000 Encounter for general adult medical examination without abnormal findings: Secondary | ICD-10-CM | POA: Diagnosis not present

## 2013-07-23 DIAGNOSIS — I10 Essential (primary) hypertension: Secondary | ICD-10-CM | POA: Diagnosis not present

## 2013-07-23 DIAGNOSIS — M4800 Spinal stenosis, site unspecified: Secondary | ICD-10-CM | POA: Diagnosis not present

## 2013-07-23 DIAGNOSIS — M1990 Unspecified osteoarthritis, unspecified site: Secondary | ICD-10-CM | POA: Diagnosis not present

## 2013-07-23 DIAGNOSIS — I4891 Unspecified atrial fibrillation: Secondary | ICD-10-CM | POA: Diagnosis not present

## 2013-07-24 DIAGNOSIS — M545 Low back pain, unspecified: Secondary | ICD-10-CM | POA: Diagnosis not present

## 2013-08-07 ENCOUNTER — Encounter: Payer: Self-pay | Admitting: Cardiovascular Disease

## 2013-08-07 ENCOUNTER — Ambulatory Visit (INDEPENDENT_AMBULATORY_CARE_PROVIDER_SITE_OTHER): Payer: Medicare Other | Admitting: Cardiovascular Disease

## 2013-08-07 VITALS — BP 130/80 | HR 74 | Ht 70.0 in | Wt 227.0 lb

## 2013-08-07 DIAGNOSIS — I4892 Unspecified atrial flutter: Secondary | ICD-10-CM
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
## 2013-08-07 NOTE — Patient Instructions (Signed)
Your physician wants you to follow-up in: 1 year  You will receive a reminder letter in the mail two months in advance. If you don't receive a letter, please call our office to schedule the follow-up appointment.  Your physician recommends that you continue on your current medications as directed. Please refer to the Current Medication list given to you today.  

## 2013-08-07 NOTE — Assessment & Plan Note (Signed)
[name] is doing well.   He has converted to NSR.  He denies any chest pain or dyspnea.   He is fairly weak and still has problems walking without his cane.    I will see him in 1 year for office visit.

## 2013-10-02 DIAGNOSIS — R310 Gross hematuria: Secondary | ICD-10-CM | POA: Diagnosis not present

## 2013-10-03 DIAGNOSIS — L570 Actinic keratosis: Secondary | ICD-10-CM | POA: Diagnosis not present

## 2013-10-05 ENCOUNTER — Other Ambulatory Visit: Payer: Self-pay | Admitting: Urology

## 2013-10-15 ENCOUNTER — Encounter (HOSPITAL_BASED_OUTPATIENT_CLINIC_OR_DEPARTMENT_OTHER): Payer: Self-pay | Admitting: *Deleted

## 2013-10-17 ENCOUNTER — Encounter (HOSPITAL_BASED_OUTPATIENT_CLINIC_OR_DEPARTMENT_OTHER): Payer: Self-pay | Admitting: *Deleted

## 2013-10-17 NOTE — Progress Notes (Signed)
NPO AFTER MN.  ARRIVE AT 0900. NEEDS ISTAT. CURRENT EKG AND CXR IN CHART AND EPIC. WILL TAKE DILTIAZEM AM DOS W/ SIPS OF WATER.  PT STATES TOLD POSS. OWER.  REVIEWED [facility] GUIDELINES , WILL BRING MEDS.

## 2013-10-17 NOTE — Progress Notes (Signed)
10/17/13 1523  OBSTRUCTIVE SLEEP APNEA  Have you ever been diagnosed with sleep apnea through a sleep study? No  Do you snore loudly (loud enough to be heard through closed doors)?  0  Do you often feel tired, fatigued, or sleepy during the daytime? 0  Has anyone observed you stop breathing during your sleep? 0  Do you have, or are you being treated for high blood pressure? 1  BMI more than 35 kg/m2? 0  Age over 78 years old? 1  Neck circumference greater than 40 cm/16 inches? 1  Gender: 1  Obstructive Sleep Apnea Score 4  Score 4 or greater  Results sent to PCP

## 2013-10-22 ENCOUNTER — Ambulatory Visit (HOSPITAL_BASED_OUTPATIENT_CLINIC_OR_DEPARTMENT_OTHER)
Admission: RE | Admit: 2013-10-22 | Discharge: 2013-10-22 | Disposition: A | Discharge status: Home / Self Care | Payer: Medicare Other | Source: Ambulatory Visit | Attending: Urology | Admitting: Urology

## 2013-10-22 ENCOUNTER — Encounter (HOSPITAL_BASED_OUTPATIENT_CLINIC_OR_DEPARTMENT_OTHER): Payer: Medicare Other | Admitting: Anesthesiology

## 2013-10-22 ENCOUNTER — Encounter (HOSPITAL_BASED_OUTPATIENT_CLINIC_OR_DEPARTMENT_OTHER): Payer: Self-pay

## 2013-10-22 ENCOUNTER — Encounter (HOSPITAL_BASED_OUTPATIENT_CLINIC_OR_DEPARTMENT_OTHER)
Admission: RE | Disposition: A | Discharge status: Home / Self Care | Payer: Self-pay | Source: Ambulatory Visit | Attending: Urology

## 2013-10-22 ENCOUNTER — Ambulatory Visit (HOSPITAL_BASED_OUTPATIENT_CLINIC_OR_DEPARTMENT_OTHER): Payer: Medicare Other | Admitting: Anesthesiology

## 2013-10-22 DIAGNOSIS — Z7982 Long term (current) use of aspirin: Secondary | ICD-10-CM | POA: Diagnosis not present

## 2013-10-22 DIAGNOSIS — Z7901 Long term (current) use of anticoagulants: Secondary | ICD-10-CM | POA: Diagnosis not present

## 2013-10-22 DIAGNOSIS — M129 Arthropathy, unspecified: Secondary | ICD-10-CM | POA: Diagnosis not present

## 2013-10-22 DIAGNOSIS — Z87891 Personal history of nicotine dependence: Secondary | ICD-10-CM | POA: Diagnosis not present

## 2013-10-22 DIAGNOSIS — N401 Enlarged prostate with lower urinary tract symptoms: Secondary | ICD-10-CM | POA: Insufficient documentation

## 2013-10-22 DIAGNOSIS — R310 Gross hematuria: Secondary | ICD-10-CM | POA: Insufficient documentation

## 2013-10-22 DIAGNOSIS — N138 Other obstructive and reflux uropathy: Secondary | ICD-10-CM | POA: Insufficient documentation

## 2013-10-22 DIAGNOSIS — I10 Essential (primary) hypertension: Secondary | ICD-10-CM | POA: Insufficient documentation

## 2013-10-22 DIAGNOSIS — I4892 Unspecified atrial flutter: Secondary | ICD-10-CM | POA: Insufficient documentation

## 2013-10-22 DIAGNOSIS — Z79899 Other long term (current) drug therapy: Secondary | ICD-10-CM | POA: Insufficient documentation

## 2013-10-22 HISTORY — DX: Personal history of other malignant neoplasm of skin: Z85.828

## 2013-10-22 HISTORY — DX: Gross hematuria: R31.0

## 2013-10-22 HISTORY — PX: TRANSURETHRAL RESECTION OF PROSTATE: SHX73

## 2013-10-22 HISTORY — DX: Dependence on other enabling machines and devices: Z99.89

## 2013-10-22 HISTORY — DX: Other specified personal risk factors, not elsewhere classified: Z91.89

## 2013-10-22 HISTORY — DX: Personal history of other specified conditions: Z87.898

## 2013-10-22 HISTORY — DX: Unspecified atrial flutter: I48.92

## 2013-10-22 LAB — POCT I-STAT 4, (NA,K, GLUC, HGB,HCT)
Glucose, Bld: 119 mg/dL — ABNORMAL HIGH (ref 70–99)
HCT: 47 % (ref 39.0–52.0)
Hemoglobin: 16 g/dL (ref 13.0–17.0)
Potassium: 3.6 mEq/L — ABNORMAL LOW (ref 3.7–5.3)
Sodium: 137 mEq/L (ref 137–147)

## 2013-10-22 SURGERY — TRANSURETHRAL RESECTION OF THE PROSTATE WITH GYRUS INSTRUMENTS
Anesthesia: General | Site: Prostate

## 2013-10-22 MED ORDER — HYDROCODONE-ACETAMINOPHEN 7.5-325 MG PO TABS: 1.0000 | ORAL_TABLET | ORAL | Status: DC | PRN

## 2013-10-22 MED ORDER — LACTATED RINGERS IV SOLN
INTRAVENOUS | Status: DC
Start: 2013-10-22 — End: 2013-10-22
  Administered 2013-10-22 (×2): via INTRAVENOUS
  Filled 2013-10-22: qty 1000

## 2013-10-22 MED ORDER — CIPROFLOXACIN IN D5W 200 MG/100ML IV SOLN
200.0000 mg | INTRAVENOUS | Status: AC
  Administered 2013-10-22: 200 mg via INTRAVENOUS
  Filled 2013-10-22: qty 100

## 2013-10-22 MED ORDER — ONDANSETRON HCL 4 MG/2ML IJ SOLN
INTRAMUSCULAR | Status: DC | PRN
  Administered 2013-10-22: 4 mg via INTRAVENOUS

## 2013-10-22 MED ORDER — DEXAMETHASONE SODIUM PHOSPHATE 4 MG/ML IJ SOLN
INTRAMUSCULAR | Status: DC | PRN
  Administered 2013-10-22: 10 mg via INTRAVENOUS

## 2013-10-22 MED ORDER — PHENAZOPYRIDINE HCL 200 MG PO TABS
200.0000 mg | ORAL_TABLET | Freq: Once | ORAL | Status: AC
  Administered 2013-10-22: 200 mg via ORAL
  Filled 2013-10-22: qty 1

## 2013-10-22 MED ORDER — LIDOCAINE HCL (CARDIAC) 20 MG/ML IV SOLN
INTRAVENOUS | Status: DC | PRN
  Administered 2013-10-22: 80 mg via INTRAVENOUS

## 2013-10-22 MED ORDER — PHENAZOPYRIDINE HCL 200 MG PO TABS: 200.0000 mg | ORAL_TABLET | Freq: Three times a day (TID) | ORAL | Status: DC | PRN

## 2013-10-22 MED ORDER — FENTANYL CITRATE 0.05 MG/ML IJ SOLN
INTRAMUSCULAR | Status: DC | PRN
  Administered 2013-10-22: 25 ug via INTRAVENOUS
  Administered 2013-10-22: 50 ug via INTRAVENOUS

## 2013-10-22 MED ORDER — PHENAZOPYRIDINE HCL 100 MG PO TABS
ORAL_TABLET | ORAL | Status: AC
  Filled 2013-10-22: qty 2

## 2013-10-22 MED ORDER — PROMETHAZINE HCL 25 MG/ML IJ SOLN
6.2500 mg | INTRAMUSCULAR | Status: DC | PRN
  Filled 2013-10-22: qty 1

## 2013-10-22 MED ORDER — FENTANYL CITRATE 0.05 MG/ML IJ SOLN
25.0000 ug | INTRAMUSCULAR | Status: DC | PRN
  Filled 2013-10-22: qty 1

## 2013-10-22 MED ORDER — FENTANYL CITRATE 0.05 MG/ML IJ SOLN
INTRAMUSCULAR | Status: AC
  Filled 2013-10-22: qty 4

## 2013-10-22 MED ORDER — ACETAMINOPHEN 10 MG/ML IV SOLN
INTRAVENOUS | Status: DC | PRN
  Administered 2013-10-22: 1000 mg via INTRAVENOUS

## 2013-10-22 MED ORDER — SODIUM CHLORIDE 0.9 % IR SOLN
Status: DC | PRN
  Administered 2013-10-22: 6000 mL via INTRAVESICAL

## 2013-10-22 MED ORDER — PROPOFOL 10 MG/ML IV BOLUS
INTRAVENOUS | Status: DC | PRN
  Administered 2013-10-22: 160 mg via INTRAVENOUS

## 2013-10-22 SURGICAL SUPPLY — 24 items
BAG DRAIN URO-CYSTO SKYTR STRL (DRAIN) ×2 IMPLANT
BAG DRN ANRFLXCHMBR STRAP LEK (BAG)
BAG DRN UROCATH (DRAIN) ×1
BAG URINE DRAINAGE (UROLOGICAL SUPPLIES) IMPLANT
BAG URINE LEG 19OZ MD ST LTX (BAG) IMPLANT
CANISTER SUCT LVC 12 LTR MEDI- (MISCELLANEOUS) ×4 IMPLANT
CATH FOLEY 3WAY 30CC 24FR (CATHETERS) ×2
CATH URTH STD 24FR FL 3W 2 (CATHETERS) ×1 IMPLANT
CLOTH BEACON ORANGE TIMEOUT ST (SAFETY) ×2 IMPLANT
DRAPE CAMERA CLOSED 9X96 (DRAPES) ×2 IMPLANT
ELECT RESECT VAPORIZE 12D CBL (ELECTRODE) ×2 IMPLANT
EVACUATOR MICROVAS BLADDER (UROLOGICAL SUPPLIES) ×2 IMPLANT
GLOVE BIO SURGEON STRL SZ8 (GLOVE) ×2 IMPLANT
GLOVE BIOGEL M STER SZ 6 (GLOVE) ×2 IMPLANT
GLOVE INDICATOR 6.5 STRL GRN (GLOVE) ×2 IMPLANT
GOWN STRL REUS W/TWL LRG LVL3 (GOWN DISPOSABLE) ×1 IMPLANT
GOWN STRL REUS W/TWL XL LVL3 (GOWN DISPOSABLE) ×2 IMPLANT
HOLDER FOLEY CATH W/STRAP (MISCELLANEOUS) IMPLANT
IV NS IRRIG 3000ML ARTHROMATIC (IV SOLUTION) ×6 IMPLANT
PACK CYSTOSCOPY (CUSTOM PROCEDURE TRAY) ×2 IMPLANT
PLUG CATH AND CAP STER (CATHETERS) IMPLANT
SET ASPIRATION TUBING (TUBING) ×2 IMPLANT
SYR 30ML LL (SYRINGE) IMPLANT
SYRINGE IRR TOOMEY STRL 70CC (SYRINGE) IMPLANT

## 2013-10-22 NOTE — H&P (Signed)
History of Present Illness    BPH with outlet obstruction: He was treated for this as far back as 11/02 with Flomax.  Urinary retention: He developed a AUR after lumbar surgery on 02/07/13.  Treatment: TURP 03/23/13 - 35 g with benign pathology    Elevated PSA: His PSA rose from 0.8-8.4 in 11/02.  TRUS/BX 04/20/01: Prostate volume - 104.1 cc  Pathology: Benign  Interval history: Stephen Choi has had ongoing gross hematuria. This has been present since at least March at which time he was found via cystoscopy to have a dystrophic calcification within the prostatic urethra and bladder neck. The hematuria seems to subside when he stops his Xarelto. Nothing makes it worse. Last month he had an episode of clot retention but eventually he "exploded" and has had no further voiding difficulties or clot passage since. The hematuria has continued and usually only occurs in his proximal stream. He denies any other LUTS or dysuria. He denies flank pain, n/v or f/c.  Past Medical History Problems  1. History of Arthritis (V13.4) 2. History of Atrial Flutter (427.32) 3. History of hypertension (V12.59)  Surgical History Problems  1. History of Eye Surgery 2. History of Hernia Repair 3. History of Laminectomy Lumbar 4. History of Tonsillectomy 5. History of Transurethral Resection Of Prostate (TURP)  Current Meds 1. Aspir-81 81 MG Oral Tablet Delayed Release;  Therapy: (Recorded:01Oct2014) to Recorded 2. Colace CAPS;  Therapy: (Recorded:01Oct2014) to Recorded 3. Diclofenac Sodium 75 MG Oral Tablet Delayed Release;  Therapy: (Recorded:01Oct2014) to Recorded 4. Diltiazem HCl ER 120 MG Oral Capsule Extended Release 24 Hour;  Therapy: (Recorded:01Oct2014) to Recorded 5. Finasteride 5 MG Oral Tablet; Take 1 tablet by mouth every day;  Therapy: 02Oct2014 to (Evaluate:27Sep2015)  Requested for: 02Oct2014; Last  Rx:02Oct2014 Ordered 6. Glucosamine Chondr 1500 Complx Oral Capsule;  Therapy:  (Recorded:01Oct2014) to Recorded 7. Hydrochlorothiazide 25 MG Oral Tablet;  Therapy: (Recorded:01Oct2014) to Recorded 8. Saw Palmetto 450 MG Oral Capsule;  Therapy: (Recorded:01Oct2014) to Recorded 9. Tamsulosin HCl - 0.4 MG Oral Capsule; TAKE TWO (2) CAPSULES BY MOUTH AT  BEDTIME;  Therapy: 02Oct2014 to (Evaluate:27Sep2015)  Requested for: 02Oct2014; Last  Rx:02Oct2014 Ordered 10. Xarelto 20 MG Oral Tablet;   Therapy: (Recorded:01Oct2014) to Recorded  Allergies Medication  1. No Known Drug Allergies  Family History Problems  1. Family history of Heart Disease (V17.49) : Father 2. Family history of Stroke Syndrome (V17.1) : Mother 3. Family history of Viral Pneumonia : Father  Social History Problems  1. Alcohol Use   7 servings of wine and 7 shots of liquor weekly 2. Former smoker [place])   smoked 1ppd x 9 years; quit smoking in 1963  Review of Systems Genitourinary, constitutional, skin, eye, otolaryngeal, hematologic/lymphatic, cardiovascular, pulmonary, endocrine, musculoskeletal, gastrointestinal, neurological and psychiatric system(s) were reviewed and pertinent findings if present are noted.  Genitourinary: urinary frequency, feelings of urinary urgency and nocturia, but no dysuria, no incontinence, no difficulty starting the urinary stream, urine stream is not weak, urinary stream does not start and stop, no incomplete emptying of bladder, no post-void dribbling, he has had gross hematuria and initiating urination does not require straining.     Vitals Vital Signs  Blood Pressure: 160 / 78 Temperature: 98.2 F Heart Rate: 76  hysical Exam Constitutional: Well nourished and well developed . No acute distress.  ENT:. The ears and nose are normal in appearance.  Neck: The appearance of the neck is normal and no neck mass is present.  Pulmonary: No respiratory  distress and normal respiratory rhythm and effort.  Cardiovascular: Heart rate and rhythm are normal . No  peripheral edema.  Abdomen: The abdomen is soft and nontender. No masses are palpated. No CVA tenderness. No hernias are palpable. No hepatosplenomegaly noted.  Rectal: Rectal exam demonstrates normal sphincter tone, no tenderness and no masses. Estimated prostate size is 3+. The prostate has no nodularity and is not tender. The left seminal vesicle is nonpalpable. The right seminal vesicle is nonpalpable. The perineum is normal on inspection.  Genitourinary: Examination of the penis demonstrates no abnormality, no discharge, no masses, no lesions and a normal meatus. The scrotum is without lesions. The right epididymis is palpably normal and non-tender. The left epididymis is palpably normal and non-tender. The right testis is non-tender and without masses. The left testis is non-tender and without masses.  Lymphatics: The femoral and inguinal nodes are not enlarged or tender.  Skin: Normal skin turgor, no visible rash and no visible skin lesions.  Neuro/Psych:. Mood and affect are appropriate.   Assessment [name] has had ongoing hematuria. He feels he needs to stay on his Xarelto to prevent clots and stroke. He would like to not have to start and stop this so frequently due to his hematuria.  We have therefore discussed proceeding with transurethral resection of a dystrophic calcification and management of any other abnormality identified at the time of cystoscopy.  We discussed the procedure in detail including its risks and complications, the probability of success, the outpatient nature of the procedure as well as the anticipated postoperative course.  He understands and has elected to proceed.    Plan Cystoscopy with resection of any dystrophic calcification or other areas of potential prostatic urethral bleeding as well as fulguration of any areas felt to possibly contribute to current hematuria.

## 2013-10-22 NOTE — Op Note (Signed)
PATIENT:  [name]  PRE-OPERATIVE DIAGNOSIS: Intermittent gross hematuria  POST-OPERATIVE DIAGNOSIS: Same  PROCEDURE: Transurethral resection and fulguration of prostatic urethra.  SURGEON:  Claybon Jabs  INDICATION: Stephen Choi is a 80 year old male who underwent a TURP for outlet obstruction in 11/14. Since that time he has had difficulty with intermittent, and sometimes quite significant, gross hematuria whenever he takes his anticoagulant Xarelto. Cystoscopically in the office I found what appeared to be dystrophic calcification and friable material associated with the dystrophic calcifications in the area of the prostatic urethra. Despite conservative management he continues to have intermittent bleeding and we therefore have discussed further evaluation and management under anesthesia. He has been off his Xarelto for 5 days now.  ANESTHESIA:  General  EBL:  Minimal  DRAINS: None  LOCAL MEDICATIONS USED:  None  SPECIMEN: None   Description of procedure: After informed consent the patient was taken to the operating room and placed on the table in a supine position. General anesthesia was then administered. Once fully anesthetized the patient was moved to the dorsal lithotomy position and the genitalia were sterilely prepped and draped in standard fashion. An official timeout was then performed.  Initially I performed cystoscopy using the 22 French rigid cystoscope and 12 lens. Ureter was noted be normal down to the sphincter which appeared intact. The prostatic urethra was resected although there were some areas near the bladder neck on the left-hand side and near the apex on the right-hand side that appeared to have some yellowish material that in fact turned out to not be dystrophic calcification but was rather soft material and this was associated with some associated inflammatory changes of the prostatic urethral mucosa adjacent to these areas. The bladder was  entered and fully inspected. The ureteral orifices were well away from the bladder neck and of normal configuration and position. The bladder itself had 1-2+ trabeculation but no tumors, stones or inflammatory lesions were seen within the bladder and there was no evidence of a bleeding site within the bladder itself.  I removed the cystoscope and then inserted the 26 French resectoscope sheath with 12 lens and visual obturator. I then drained the bladder and inserted the Gyrus button element. I used cut to eliminate all of the friable-appearing material and then used coag to fulgurate any bleeding from these areas. This was performed in the 2 areas where there appeared to be friable, old, necrotic tissue. I also fulgurated other areas that appeared to be somewhat inflamed with superficial vessels that were very likely also potential bleeding sites. Once I had completed this there did not appear to be any active bleeding. I therefore drained the bladder and the resectoscope was removed. The patient was then awakened and taken recovery room in stable and satisfactory condition. He tolerated procedure well no intraoperative complications.  PLAN OF CARE: Discharge to home after PACU  PATIENT DISPOSITION:  PACU - hemodynamically stable.

## 2013-10-22 NOTE — Transfer of Care (Signed)
Immediate Anesthesia Transfer of Care Note  Patient: Stephen Choi  Procedure(s) Performed: Procedure(s): CYSTOSCOPY/TRANSURETHRAL RESECTION OF THE PROSTATE WITH GYRUS  (N/A)  Patient Location: PACU  Anesthesia Type:General  Level of Consciousness: sedated  Airway & Oxygen Therapy: Patient Spontanous Breathing and Patient connected to nasal cannula oxygen  Post-op Assessment: Report given to PACU RN  Post vital signs: Reviewed and stable  Complications: No apparent anesthesia complications

## 2013-10-22 NOTE — Progress Notes (Signed)
Waiting for ride to come

## 2013-10-22 NOTE — Anesthesia Postprocedure Evaluation (Signed)
  Anesthesia Post-op Note  Patient: Stephen Choi  Procedure(s) Performed: Procedure(s) (LRB): CYSTOSCOPY/TRANSURETHRAL RESECTION OF THE PROSTATE WITH GYRUS  (N/A)  Patient Location: PACU  Anesthesia Type: General  Level of Consciousness: awake and alert   Airway and Oxygen Therapy: Patient Spontanous Breathing  Post-op Pain: mild  Post-op Assessment: Post-op Vital signs reviewed, Patient's Cardiovascular Status Stable, Respiratory Function Stable, Patent Airway and No signs of Nausea or vomiting  Last Vitals:  Filed Vitals:   10/22/13 1155  BP:   Pulse: 67  Temp:   Resp: 14    Post-op Vital Signs: stable   Complications: No apparent anesthesia complications

## 2013-10-22 NOTE — Discharge Instructions (Addendum)
Post Bladder Surgery Instructions   General instructions:     Your recent bladder surgery requires very little post hospital care but some definite precautions.  Despite the fact that no skin incisions were used, the area around the bladder incisions are raw and covered with scabs to promote healing and prevent bleeding. Certain precautions are needed to insure that the scabs are not disturbed over the next 2-4 weeks while the healing proceeds.  Because the raw surface inside your bladder and the irritating effects of urine you may expect frequency of urination and/or urgency (a stronger desire to urinate) and perhaps even getting up at night more often. This will usually resolve or improve slowly over the healing period. You may see some blood in your urine over the first 6 weeks. Do not be alarmed, even if the urine was clear for a while. Get off your feet and drink lots of fluids until clearing occurs. If you start to pass clots or don't improve call us.  Catheter: (If you are discharged with a catheter.)  1. Keep your catheter secured to your leg at all times with tape or the supplied strap. 2. You may experience leakage of urine around your catheter- as long as the  catheter continues to drain, this is normal.  If your catheter stops draining  go to the ER. 3. You may also have blood in your urine, even after it has been clear for  several days; you may even pass some small blood clots or other material.  This  is normal as well.  If this happens, sit down and drink plenty of water to help  make urine to flush out your bladder.  If the blood in your urine becomes worse  after doing this, contact our office or return to the ER. 4. You may use the leg bag (small bag) during the day, but use the large bag at  night.  Diet:  You may return to your normal diet immediately. Because of the raw surface of your bladder, alcohol, spicy foods, foods high in acid and drinks with caffeine may  cause irritation or frequency and should be used in moderation. To keep your urine flowing freely and avoid constipation, drink plenty of fluids during the day (8-10 glasses). Tip: Avoid cranberry juice because it is very acidic.  Activity:  Your physical activity doesn't need to be restricted. However, if you are very active, you may see some blood in the urine. We suggest that you reduce your activity under the circumstances until the bleeding has stopped.  Bowels:  It is important to keep your bowels regular during the postoperative period. Straining with bowel movements can cause bleeding. A bowel movement every other day is reasonable. Use a mild laxative if needed, such as milk of magnesia 2-3 tablespoons, or 2 Dulcolax tablets. Call if you continue to have problems. If you had been taking narcotics for pain, before, during or after your surgery, you may be constipated. Take a laxative if necessary.    Medication:  You should resume your pre-surgery medications unless told not to. In addition you may be given an antibiotic to prevent or treat infection. Antibiotics are not always necessary. All medication should be taken as prescribed until the bottles are finished unless you are having an unusual reaction to one of the drugs.  Hold Xarelto for 48 hours and if the urine is clear then you may restart the medication at that time.   [place]  Instructions  Activity: Get plenty of rest for the remainder of the day. A responsible adult should stay with you for 24 hours following the procedure.  For the next 24 hours, DO NOT: -Drive a car -Paediatric nurse -Drink alcoholic beverages -Take any medication unless instructed by your physician -Make any legal decisions or sign important papers.  Meals: Start with liquid foods such as gelatin or soup. Progress to regular foods as tolerated. Avoid greasy, spicy, heavy foods. If nausea and/or vomiting occur, drink only clear  liquids until the nausea and/or vomiting subsides. Call your physician if vomiting continues.  Special Instructions/Symptoms: Your throat may feel dry or sore from the anesthesia or the breathing tube placed in your throat during surgery. If this causes discomfort, gargle with warm salt water. The discomfort should disappear within 24 hours.  Post Anesthesia Home Care Instructions  Activity: Get plenty of rest for the remainder of the day. A responsible adult should stay with you for 24 hours following the procedure.  For the next 24 hours, DO NOT: -Drive a car -Paediatric nurse -Drink alcoholic beverages -Take any medication unless instructed by your physician -Make any legal decisions or sign important papers.  Meals: Start with liquid foods such as gelatin or soup. Progress to regular foods as tolerated. Avoid greasy, spicy, heavy foods. If nausea and/or vomiting occur, drink only clear liquids until the nausea and/or vomiting subsides. Call your physician if vomiting continues.  Special Instructions/Symptoms: Your throat may feel dry or sore from the anesthesia or the breathing tube placed in your throat during surgery. If this causes discomfort, gargle with warm salt water. The discomfort should disappear within 24 hours.

## 2013-10-22 NOTE — Anesthesia Preprocedure Evaluation (Signed)
Anesthesia Evaluation  Patient identified by MRN, date of birth, ID band Patient awake    Reviewed: Allergy & Precautions, H&P , NPO status , Patient's Chart, lab work & pertinent test results  Airway Mallampati: II TM Distance: >3 FB Neck ROM: Limited    Dental no notable dental hx.    Pulmonary neg pulmonary ROS, former smoker,  breath sounds clear to auscultation  Pulmonary exam normal       Cardiovascular hypertension, Pt. on medications negative cardio ROS  + dysrhythmias Atrial Fibrillation Rhythm:Regular Rate:Normal     Neuro/Psych negative neurological ROS  negative psych ROS   GI/Hepatic negative GI ROS, Neg liver ROS,   Endo/Other  negative endocrine ROS  Renal/GU negative Renal ROS  negative genitourinary   Musculoskeletal negative musculoskeletal ROS (+)   Abdominal   Peds negative pediatric ROS (+)  Hematology negative hematology ROS (+)   Anesthesia Other Findings   Reproductive/Obstetrics negative OB ROS                           Anesthesia Physical Anesthesia Plan  ASA: III  Anesthesia Plan: General   Post-op Pain Management:    Induction: Intravenous  Airway Management Planned: LMA  Additional Equipment:   Intra-op Plan:   Post-operative Plan: Extubation in OR  Informed Consent: I have reviewed the patients History and Physical, chart, labs and discussed the procedure including the risks, benefits and alternatives for the proposed anesthesia with the patient or authorized representative who has indicated his/her understanding and acceptance.   Dental advisory given  Plan Discussed with: CRNA and Surgeon  Anesthesia Plan Comments:         Anesthesia Quick Evaluation

## 2013-10-22 NOTE — Anesthesia Procedure Notes (Signed)
Procedure Name: LMA Insertion Date/Time: 10/22/2013 10:31 AM Performed by: Bethena Roys T Pre-anesthesia Checklist: Patient identified, Emergency Drugs available, Suction available and Patient being monitored Patient Re-evaluated:Patient Re-evaluated prior to inductionOxygen Delivery Method: Circle System Utilized Preoxygenation: Pre-oxygenation with 100% oxygen Intubation Type: IV induction Ventilation: Mask ventilation without difficulty LMA: LMA inserted LMA Size: 5.0 Number of attempts: 1 Airway Equipment and Method: bite block Placement Confirmation: positive ETCO2 Dental Injury: Teeth and Oropharynx as per pre-operative assessment

## 2013-10-24 ENCOUNTER — Encounter (HOSPITAL_BASED_OUTPATIENT_CLINIC_OR_DEPARTMENT_OTHER): Payer: Self-pay | Admitting: Urology

## 2013-10-29 DIAGNOSIS — R310 Gross hematuria: Secondary | ICD-10-CM | POA: Diagnosis not present

## 2014-01-24 IMAGING — CT CT ABDOMEN WO/W CM
4 of 9 series · 9 of 32 positions shown, 13 images · IV contrast (omnipaque)
Comparison: Ultrasound the kidneys of 08/29/2012

CLINICAL DATA: Renal cysts noted by ultrasound, follow-up

CT ABDOMEN WITHOUT AND WITH CONTRAST
TECHNIQUE: Multidetector CT imaging of the abdomen was performed
following the standard protocol before and during bolus
administration of intravenous contrast.
Contrast: 125mL OMNIPAQUE IOHEXOL 300 MG/ML  SOLN

[Series 4: arterial/nephrographic · axial · arterial · 0.82mm/px · z∈[-236,-106]mm · 3 of 208 slices shown, 7 images]
[im 52/208  soft-tissue]
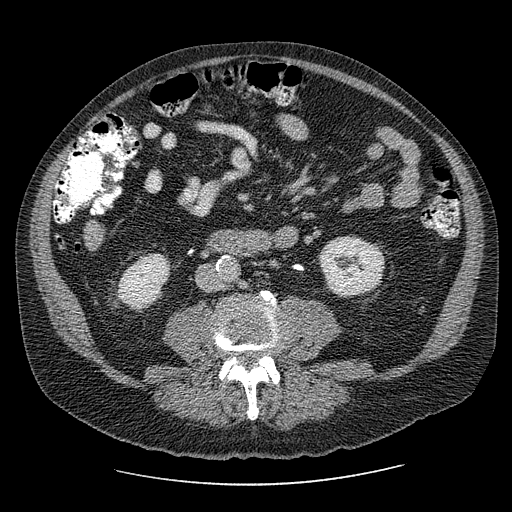
[im 52/208  lung]
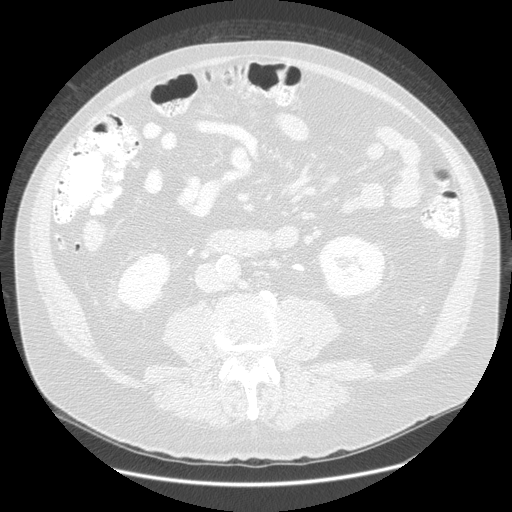
[im 52/208  bone]
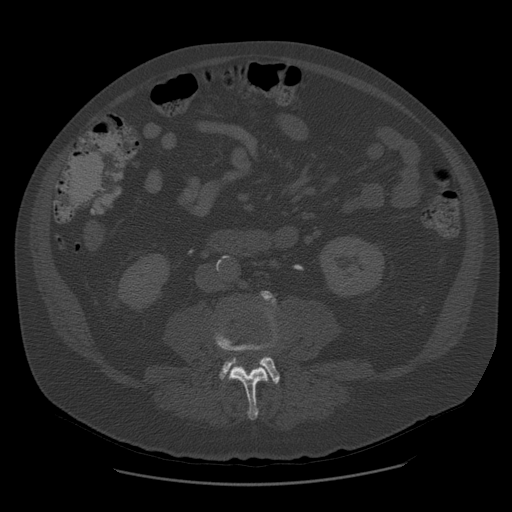
[im 104/208  soft-tissue]
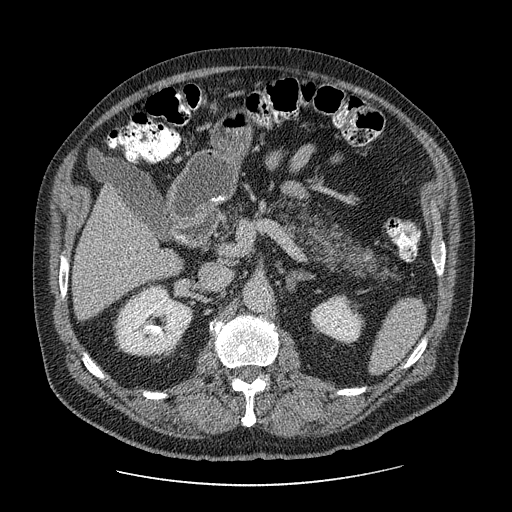
[im 104/208  lung]
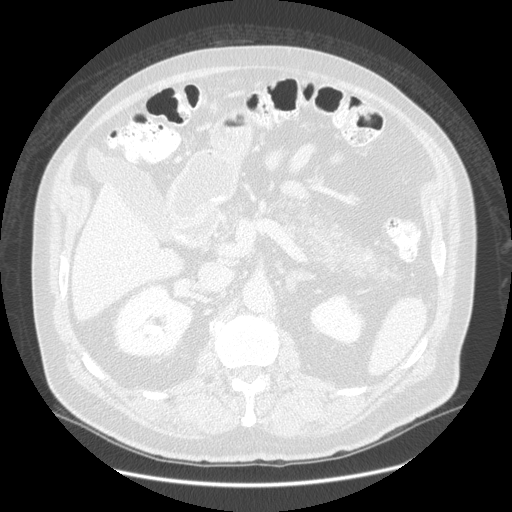
[im 156/208  soft-tissue]
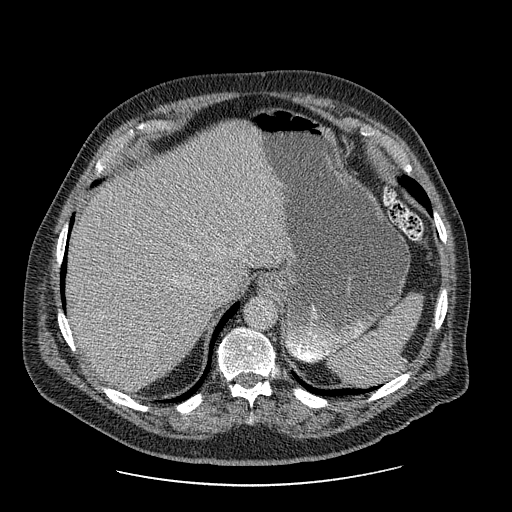
[im 156/208  lung]
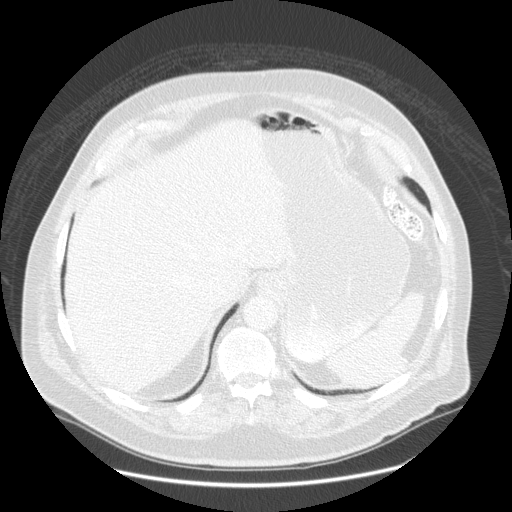

[Series 103: sag · sagittal · 0.82mm/px · 2 of 146 slices shown]
[im 49/146  soft-tissue]
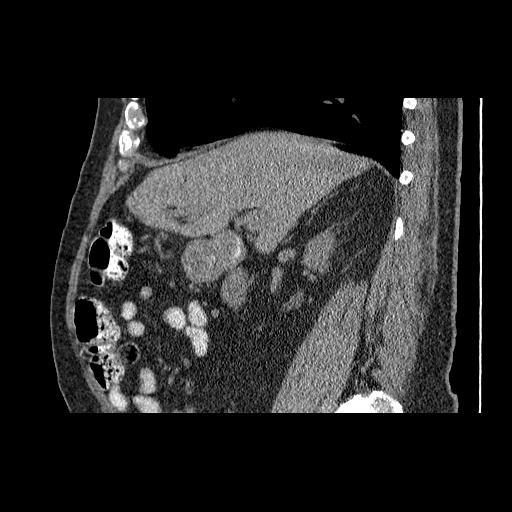
[im 97/146  soft-tissue]
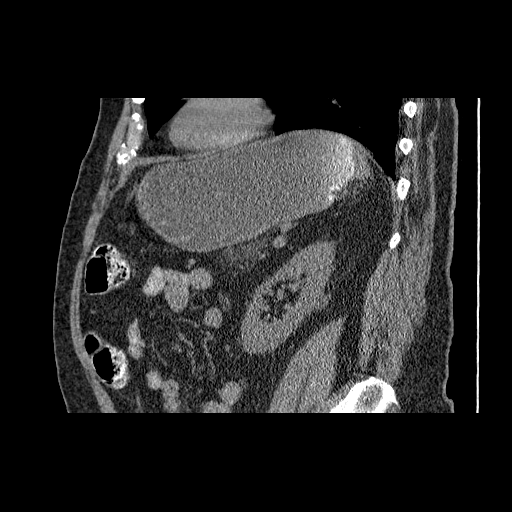

[Series 105: w/o cor · coronal · non-contrast · 0.82mm/px · 2 of 137 slices shown]
[im 46/137  soft-tissue]
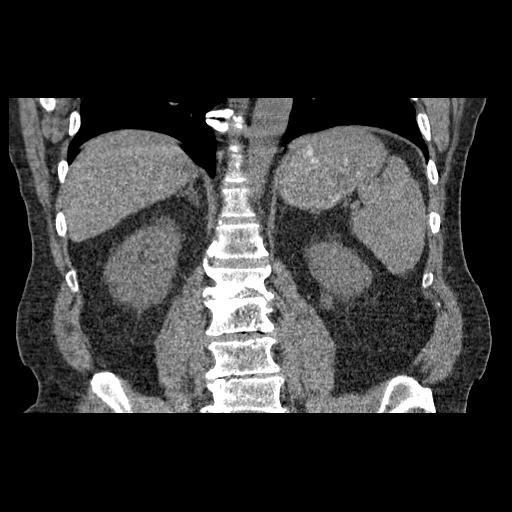
[im 91/137  soft-tissue]
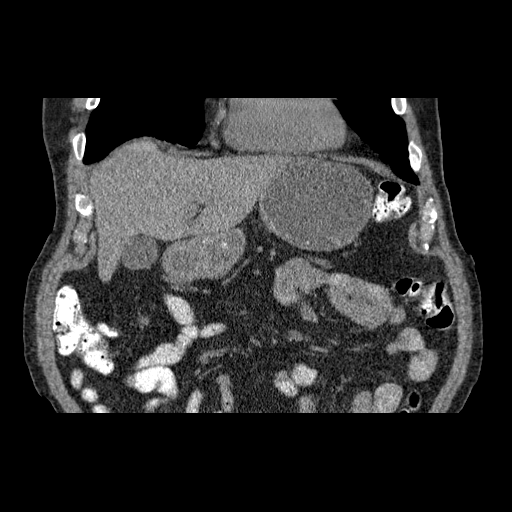

[Series 106: art sag · sagittal · 0.82mm/px · 2 of 147 slices shown]
[im 49/147  soft-tissue]
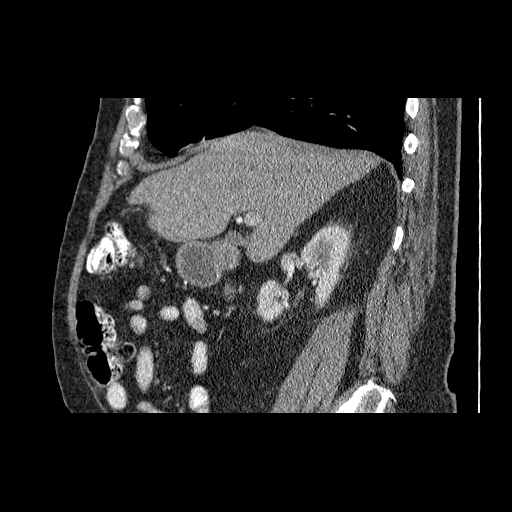
[im 98/147  soft-tissue]
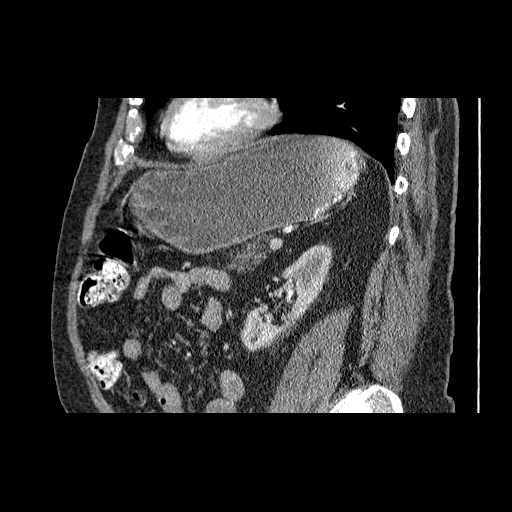

[9 of 32 positions shown; findings below may reference images not displayed]

BUN and creatinine were obtained on site at [HOSPITAL] at
[HOSPITAL]..
Results:  BUN 17 mg/dL,  Creatinine 1.0 mg/dL.
FINDINGS: The lung bases are clear.  On the unenhanced study, the
liver is unremarkable.  No calcified gallstones are seen.  The
pancreas appears fatty infiltrated and the pancreatic duct is not
dilated.  The right adrenal gland is unremarkable with probable
incidental adrenal adenoma on the left.  The spleen is normal in
size.  The stomach is moderately fluid distended with no
abnormality noted.  There are small nonobstructing bilateral renal
calculi present.  The small exophytic hypoechoic structure noted on
ultrasound in the left mid lateral kidney is not well seen on the
unenhanced study.  The abdominal aorta is normal in caliber.  No
adenopathy is seen.

After contrast administration, the liver enhances with no focal
abnormality.  The right kidney enhances with no suspicious solid
lesion.  Again nonobstructing right renal calculi are noted.

On coronal images, there is some strandiness adjacent to the mid
lateral left renal margin, but no discrete lesion is evident.
There may be a tiny low attenuation structure in the lower pole of
the left kidney laterally which may correspond to the area
questioned on ultrasound, and this is most consistent with a tiny
exophytic cyst but it is difficult to assess due to the small size.
Delayed images also were reviewed and no suspicious renal lesion is
noted.  The pelvocaliceal systems are unremarkable.

The abdominal aorta is normal in caliber.  The abdominal aorta is
somewhat ectatic.  The origins of the celiac axis and SMA are
patent. Four right and two left renal arteries are noted.
Degenerative disc disease is noted throughout the lumbar spine.
IMPRESSION: 1.  No suspicious left renal lesion is seen.  There may be a tiny
low attenuation structure emanating from the lower pole of the left
kidney but this is too small to accurately assessed.
2.  Small nonobstructing bilateral renal calculi.
3.  Four small right renal arteries and two left renal arteries are
noted.
4.  Considerable degenerative disc disease throughout the lumbar
spine.

## 2014-01-28 DIAGNOSIS — N400 Enlarged prostate without lower urinary tract symptoms: Secondary | ICD-10-CM | POA: Diagnosis not present

## 2014-02-01 DIAGNOSIS — I10 Essential (primary) hypertension: Secondary | ICD-10-CM | POA: Diagnosis not present

## 2014-02-01 DIAGNOSIS — E785 Hyperlipidemia, unspecified: Secondary | ICD-10-CM | POA: Diagnosis not present

## 2014-02-01 DIAGNOSIS — M4800 Spinal stenosis, site unspecified: Secondary | ICD-10-CM | POA: Diagnosis not present

## 2014-02-01 DIAGNOSIS — Z6833 Body mass index (BMI) 33.0-33.9, adult: Secondary | ICD-10-CM | POA: Diagnosis not present

## 2014-02-01 DIAGNOSIS — Z23 Encounter for immunization: Secondary | ICD-10-CM | POA: Diagnosis not present

## 2014-02-01 DIAGNOSIS — I4891 Unspecified atrial fibrillation: Secondary | ICD-10-CM | POA: Diagnosis not present

## 2014-02-01 DIAGNOSIS — R7301 Impaired fasting glucose: Secondary | ICD-10-CM | POA: Diagnosis not present

## 2014-05-02 DIAGNOSIS — D0460 Carcinoma in situ of skin of unspecified upper limb, including shoulder: Secondary | ICD-10-CM | POA: Diagnosis not present

## 2014-05-02 DIAGNOSIS — C44629 Squamous cell carcinoma of skin of left upper limb, including shoulder: Secondary | ICD-10-CM | POA: Diagnosis not present

## 2014-05-02 DIAGNOSIS — L570 Actinic keratosis: Secondary | ICD-10-CM | POA: Diagnosis not present

## 2014-05-02 DIAGNOSIS — D0462 Carcinoma in situ of skin of left upper limb, including shoulder: Secondary | ICD-10-CM | POA: Diagnosis not present

## 2014-05-02 DIAGNOSIS — C44621 Squamous cell carcinoma of skin of unspecified upper limb, including shoulder: Secondary | ICD-10-CM | POA: Diagnosis not present

## 2014-05-02 DIAGNOSIS — D485 Neoplasm of uncertain behavior of skin: Secondary | ICD-10-CM | POA: Diagnosis not present

## 2014-05-31 DIAGNOSIS — R310 Gross hematuria: Secondary | ICD-10-CM | POA: Diagnosis not present

## 2014-06-04 DIAGNOSIS — C44621 Squamous cell carcinoma of skin of unspecified upper limb, including shoulder: Secondary | ICD-10-CM | POA: Diagnosis not present

## 2014-06-04 DIAGNOSIS — L570 Actinic keratosis: Secondary | ICD-10-CM | POA: Diagnosis not present

## 2014-06-04 DIAGNOSIS — D0460 Carcinoma in situ of skin of unspecified upper limb, including shoulder: Secondary | ICD-10-CM | POA: Diagnosis not present

## 2014-06-12 DIAGNOSIS — N420 Calculus of prostate: Secondary | ICD-10-CM | POA: Diagnosis not present

## 2014-06-12 DIAGNOSIS — R310 Gross hematuria: Secondary | ICD-10-CM | POA: Diagnosis not present

## 2014-06-30 IMAGING — CR DG CHEST 2V
2 series · 2 of 2 positions shown · non-contrast
Comparison: None.

CLINICAL DATA: Preop respiratory exam for lumbar spine surgery.

EXAM:
CHEST  2 VIEW

[w chest pa]
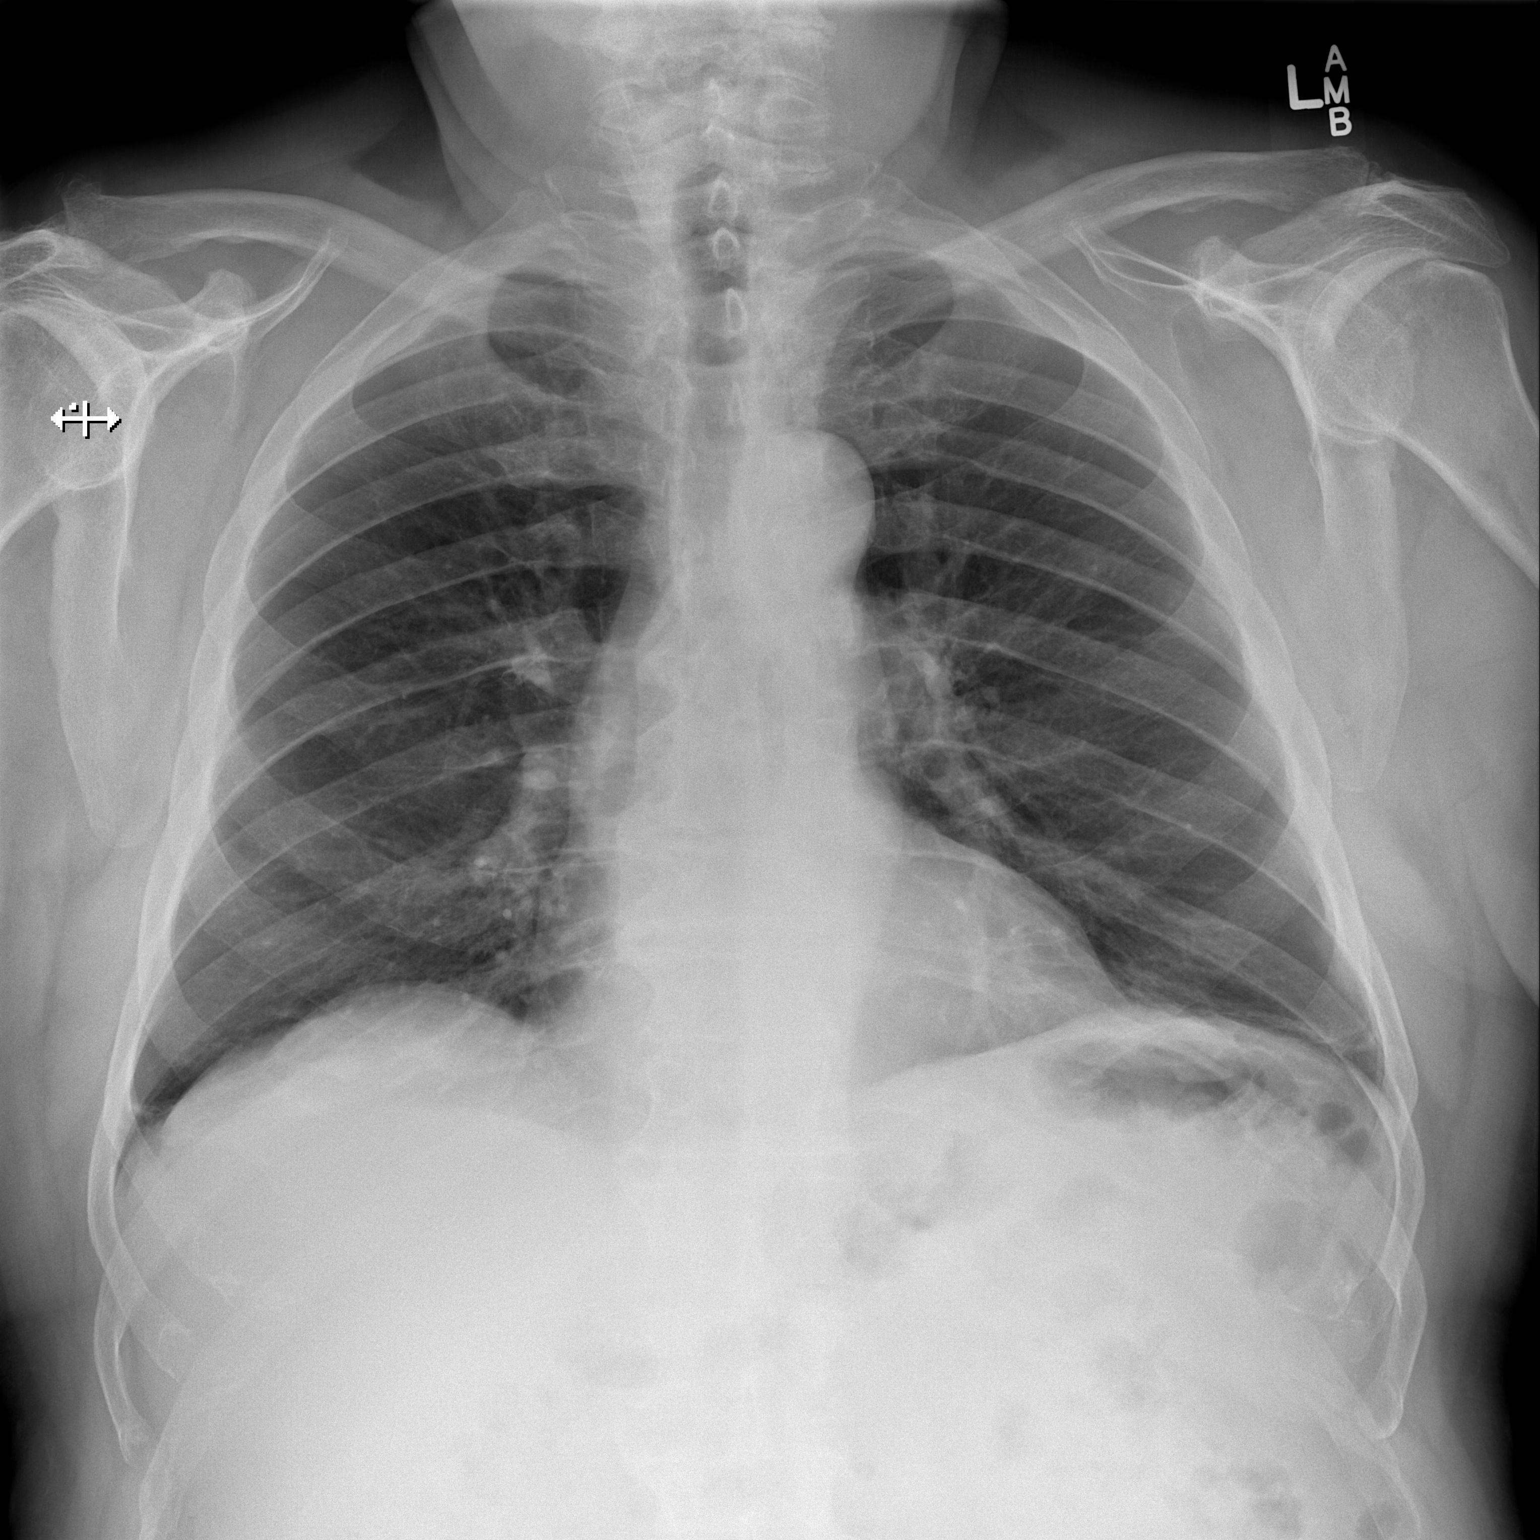

[w chest lat]
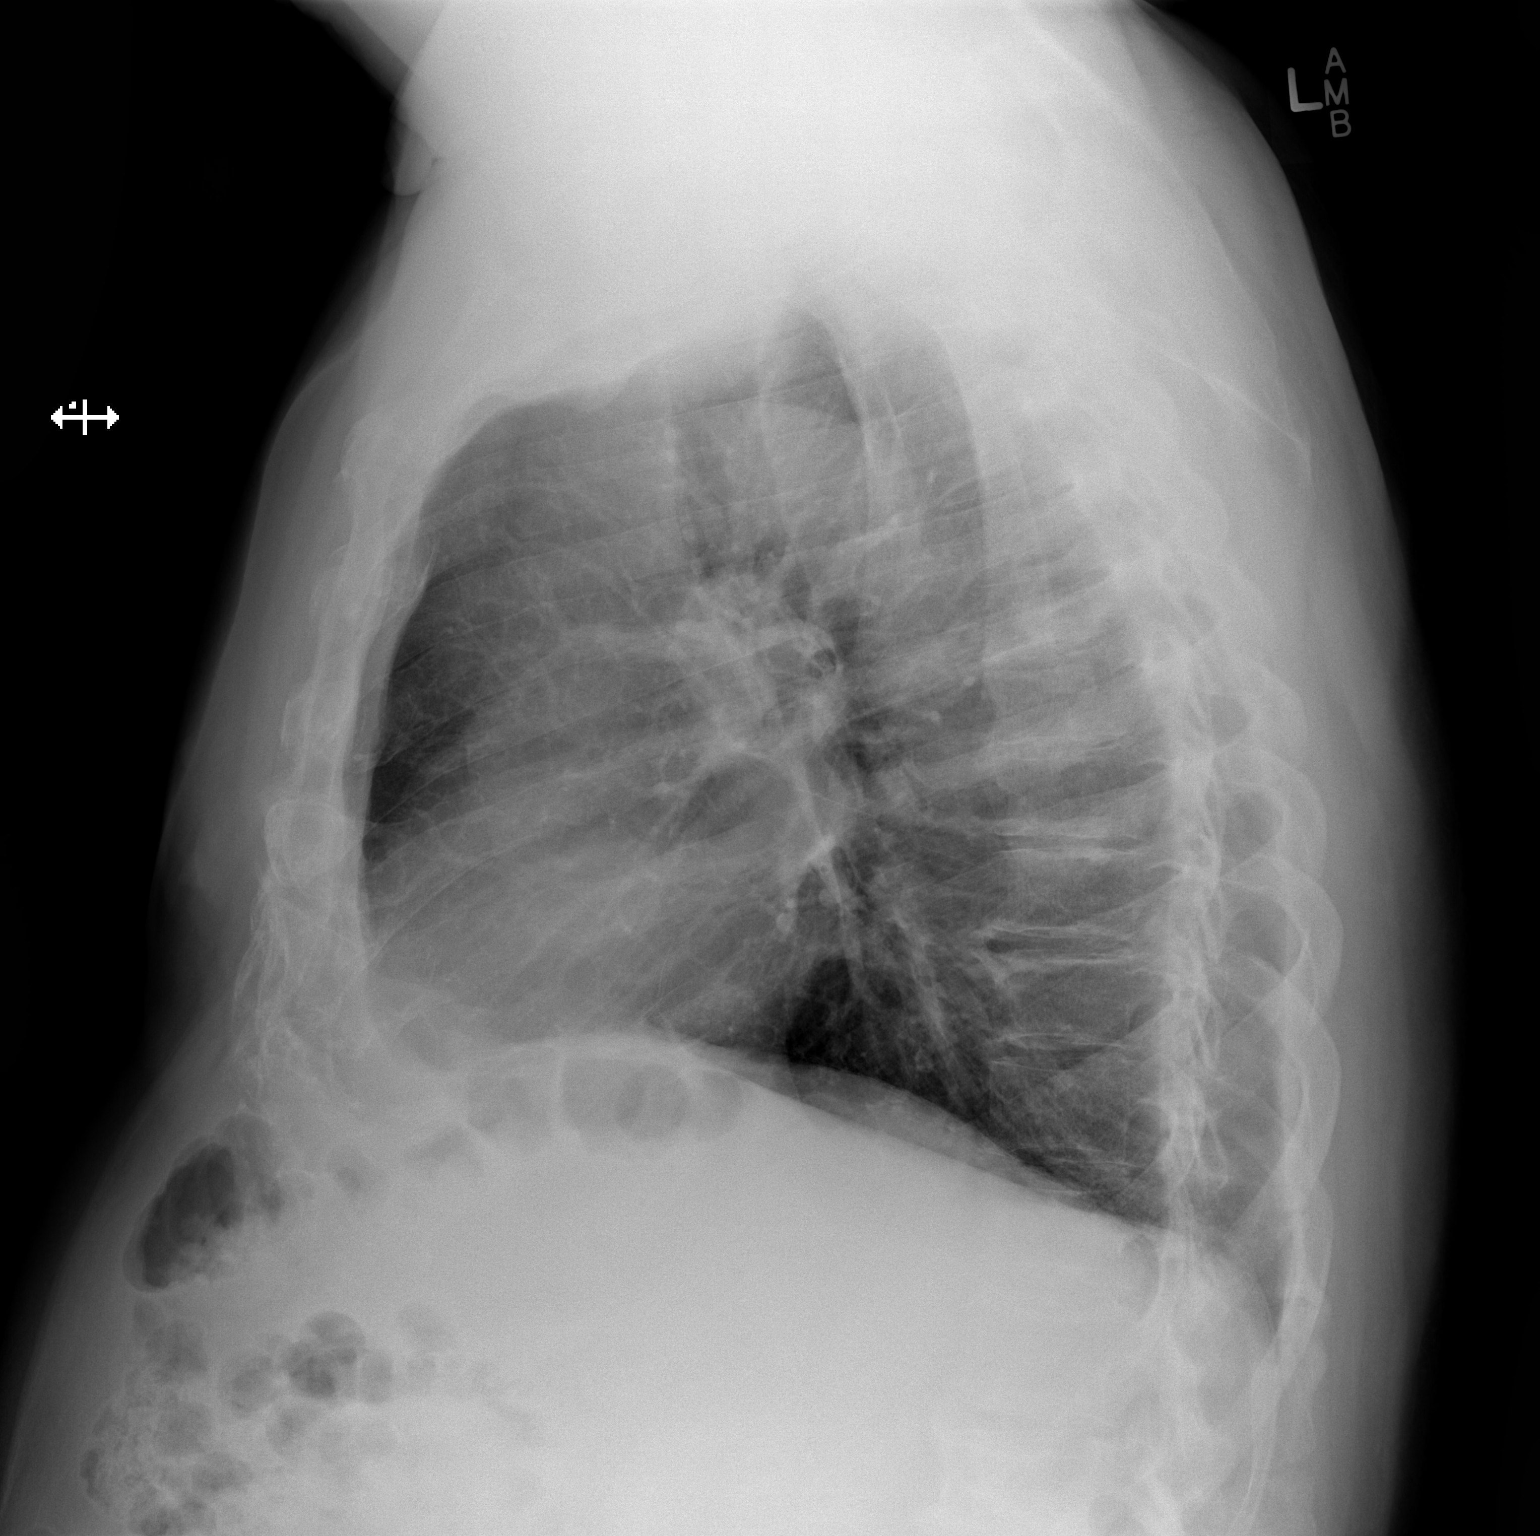

[2 of 2 positions shown; findings below may reference images not displayed]

FINDINGS: The heart size and mediastinal contours are within normal limits.
Both lungs are clear, except for mild scarring in the left lung
base. The visualized skeletal structures are unremarkable.
IMPRESSION: No active cardiopulmonary disease.

## 2014-07-02 IMAGING — RF DG LUMBAR SPINE 2-3V
1 series · 2 of 2 positions shown · non-contrast
Comparison: Abdominal pelvic CT 09/01/2012.

CLINICAL DATA: Lumbar fusion.

EXAM:
LUMBAR SPINE - 2-3 VIEW; DG C-ARM 1-60 MIN

[Series 1: run · 2 of 2 slices shown]
[im 1/2]
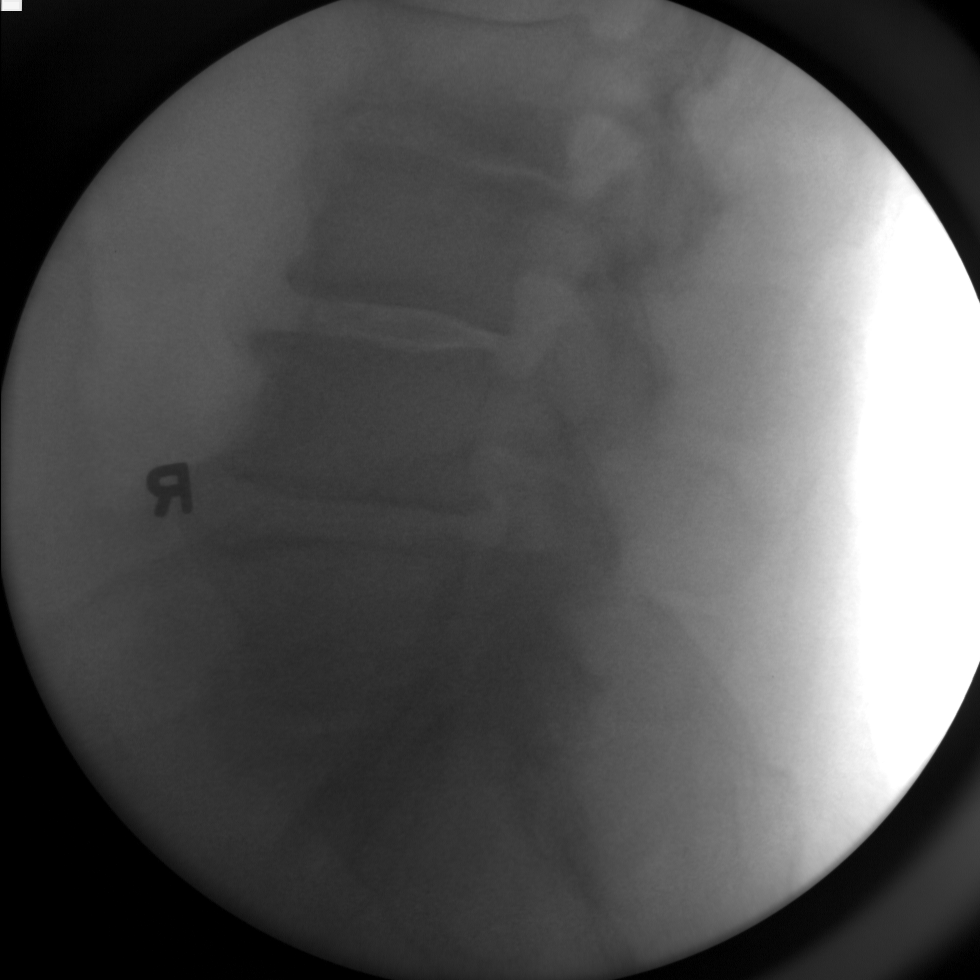
[im 2/2]
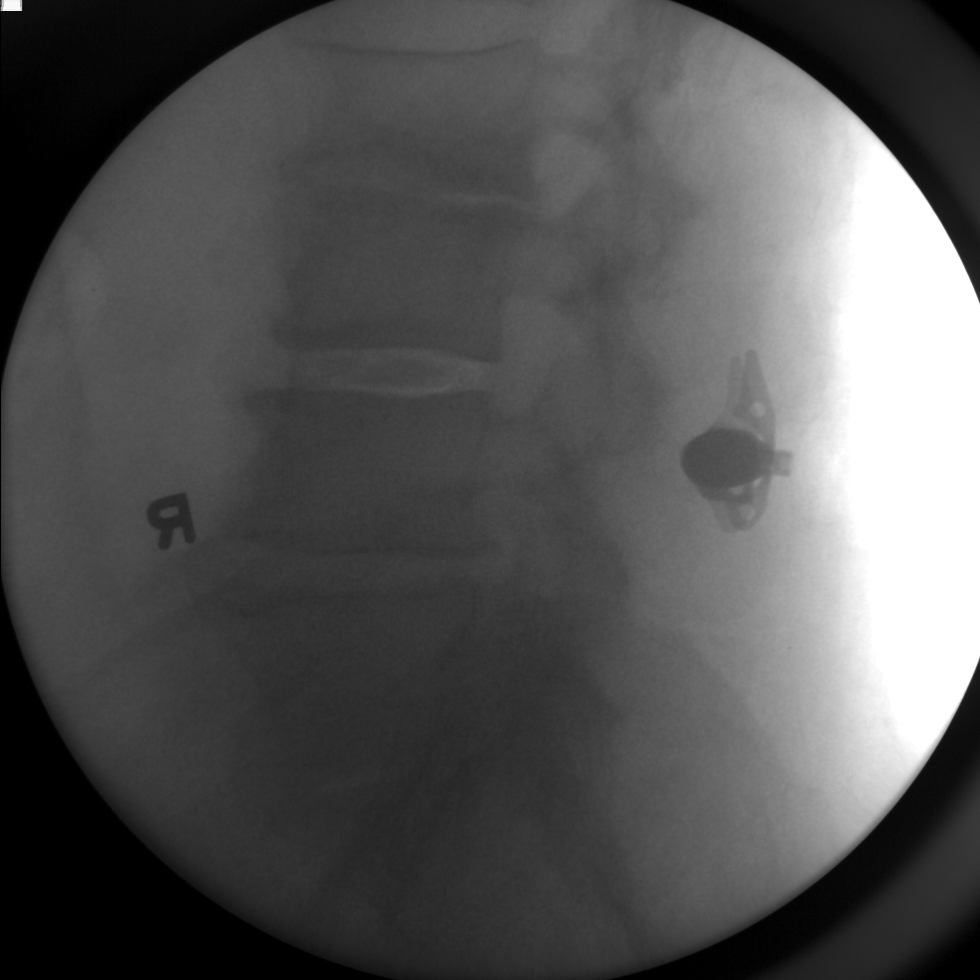

[2 of 2 positions shown; findings below may reference images not displayed]

FINDINGS: Two lateral spot fluoroscopic images of the lower lumbar spine are
submitted. Five lumbar type vertebral bodies are assumed. There is
chronic disc space loss and endplate sclerosis at L2-3. These views
demonstrate interspinous fusion at L3-4 within X-Stop device.
IMPRESSION: Intraoperative views following L3-4 interspinous fusion

## 2014-07-29 DIAGNOSIS — Z1212 Encounter for screening for malignant neoplasm of rectum: Secondary | ICD-10-CM | POA: Diagnosis not present

## 2014-07-29 DIAGNOSIS — R8290 Unspecified abnormal findings in urine: Secondary | ICD-10-CM | POA: Diagnosis not present

## 2014-07-29 DIAGNOSIS — Z125 Encounter for screening for malignant neoplasm of prostate: Secondary | ICD-10-CM | POA: Diagnosis not present

## 2014-07-29 DIAGNOSIS — E785 Hyperlipidemia, unspecified: Secondary | ICD-10-CM | POA: Diagnosis not present

## 2014-07-29 DIAGNOSIS — R7302 Impaired glucose tolerance (oral): Secondary | ICD-10-CM | POA: Diagnosis not present

## 2014-07-29 DIAGNOSIS — I10 Essential (primary) hypertension: Secondary | ICD-10-CM | POA: Diagnosis not present

## 2014-08-05 DIAGNOSIS — R945 Abnormal results of liver function studies: Secondary | ICD-10-CM | POA: Diagnosis not present

## 2014-08-05 DIAGNOSIS — Z1389 Encounter for screening for other disorder: Secondary | ICD-10-CM | POA: Diagnosis not present

## 2014-08-05 DIAGNOSIS — R221 Localized swelling, mass and lump, neck: Secondary | ICD-10-CM | POA: Diagnosis not present

## 2014-08-05 DIAGNOSIS — I10 Essential (primary) hypertension: Secondary | ICD-10-CM | POA: Diagnosis not present

## 2014-08-05 DIAGNOSIS — Z0000 Encounter for general adult medical examination without abnormal findings: Secondary | ICD-10-CM | POA: Diagnosis not present

## 2014-08-05 DIAGNOSIS — E785 Hyperlipidemia, unspecified: Secondary | ICD-10-CM | POA: Diagnosis not present

## 2014-08-05 DIAGNOSIS — M4800 Spinal stenosis, site unspecified: Secondary | ICD-10-CM | POA: Diagnosis not present

## 2014-08-05 DIAGNOSIS — Z6834 Body mass index (BMI) 34.0-34.9, adult: Secondary | ICD-10-CM | POA: Diagnosis not present

## 2014-08-05 DIAGNOSIS — I480 Paroxysmal atrial fibrillation: Secondary | ICD-10-CM | POA: Diagnosis not present

## 2014-08-05 DIAGNOSIS — M1990 Unspecified osteoarthritis, unspecified site: Secondary | ICD-10-CM | POA: Diagnosis not present

## 2014-08-05 DIAGNOSIS — R7302 Impaired glucose tolerance (oral): Secondary | ICD-10-CM | POA: Diagnosis not present

## 2014-08-07 ENCOUNTER — Encounter: Payer: Self-pay | Admitting: Cardiovascular Disease

## 2014-08-07 ENCOUNTER — Ambulatory Visit (INDEPENDENT_AMBULATORY_CARE_PROVIDER_SITE_OTHER): Payer: Medicare Other | Admitting: Cardiovascular Disease

## 2014-08-07 VITALS — BP 140/80 | HR 63 | Ht 70.0 in | Wt 232.6 lb

## 2014-08-07 DIAGNOSIS — I4892 Unspecified atrial flutter: Secondary | ICD-10-CM

## 2014-08-07 NOTE — Patient Instructions (Signed)
Your physician recommends that you continue on your current medications as directed. Please refer to the Current Medication list given to you today.  Your physician wants you to follow-up in: 1 year follow up with Dr. Acie Fredrickson. You will receive a reminder letter in the mail two months in advance. If you don't receive a letter, please call our office to schedule the follow-up appointment.
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
## 2014-08-15 DIAGNOSIS — I10 Essential (primary) hypertension: Secondary | ICD-10-CM | POA: Diagnosis not present

## 2014-08-15 DIAGNOSIS — R221 Localized swelling, mass and lump, neck: Secondary | ICD-10-CM | POA: Diagnosis not present

## 2014-08-15 DIAGNOSIS — R945 Abnormal results of liver function studies: Secondary | ICD-10-CM | POA: Diagnosis not present

## 2014-08-15 DIAGNOSIS — Z7901 Long term (current) use of anticoagulants: Secondary | ICD-10-CM | POA: Diagnosis not present

## 2014-08-29 ENCOUNTER — Other Ambulatory Visit: Payer: Self-pay | Admitting: Internal Medicine

## 2014-08-29 DIAGNOSIS — E041 Nontoxic single thyroid nodule: Secondary | ICD-10-CM

## 2014-09-04 ENCOUNTER — Other Ambulatory Visit: Payer: Self-pay | Admitting: Internal Medicine

## 2014-09-04 DIAGNOSIS — R221 Localized swelling, mass and lump, neck: Secondary | ICD-10-CM

## 2014-09-05 ENCOUNTER — Other Ambulatory Visit: Payer: Self-pay | Admitting: Internal Medicine

## 2014-09-05 DIAGNOSIS — R221 Localized swelling, mass and lump, neck: Secondary | ICD-10-CM

## 2014-09-06 ENCOUNTER — Ambulatory Visit
Admission: RE | Admit: 2014-09-06 | Discharge: 2014-09-06 | Disposition: A | Discharge status: Home / Self Care | Payer: Medicare Other | Source: Ambulatory Visit | Attending: Internal Medicine | Admitting: Internal Medicine

## 2014-09-06 ENCOUNTER — Other Ambulatory Visit: Payer: TRICARE For Life (TFL)

## 2014-09-06 DIAGNOSIS — R221 Localized swelling, mass and lump, neck: Secondary | ICD-10-CM

## 2014-09-06 MED ORDER — IOPAMIDOL (ISOVUE-300) INJECTION 61%
75.0000 mL | Freq: Once | INTRAVENOUS | Status: AC | PRN
  Administered 2014-09-06: 75 mL via INTRAVENOUS

## 2014-09-11 ENCOUNTER — Inpatient Hospital Stay: Admission: RE | Admit: 2014-09-11 | Payer: TRICARE For Life (TFL) | Source: Ambulatory Visit

## 2014-09-11 ENCOUNTER — Other Ambulatory Visit: Payer: TRICARE For Life (TFL)

## 2014-10-01 ENCOUNTER — Ambulatory Visit
Admission: RE | Admit: 2014-10-01 | Discharge: 2014-10-01 | Disposition: A | Discharge status: Home / Self Care | Payer: Medicare Other | Source: Ambulatory Visit | Attending: Internal Medicine | Admitting: Internal Medicine

## 2014-10-01 ENCOUNTER — Other Ambulatory Visit (HOSPITAL_COMMUNITY)
Admission: RE | Admit: 2014-10-01 | Discharge: 2014-10-01 | Disposition: A | Discharge status: Home / Self Care | Payer: Medicare Other | Source: Ambulatory Visit | Attending: Diagnostic Radiology | Admitting: Diagnostic Radiology

## 2014-10-01 DIAGNOSIS — E041 Nontoxic single thyroid nodule: Secondary | ICD-10-CM | POA: Insufficient documentation

## 2014-10-01 DIAGNOSIS — E042 Nontoxic multinodular goiter: Secondary | ICD-10-CM | POA: Diagnosis not present

## 2014-11-05 DIAGNOSIS — R310 Gross hematuria: Secondary | ICD-10-CM | POA: Diagnosis not present

## 2014-11-05 DIAGNOSIS — N420 Calculus of prostate: Secondary | ICD-10-CM | POA: Diagnosis not present

## 2014-11-11 ENCOUNTER — Other Ambulatory Visit: Payer: Self-pay

## 2015-02-03 DIAGNOSIS — Z6832 Body mass index (BMI) 32.0-32.9, adult: Secondary | ICD-10-CM | POA: Diagnosis not present

## 2015-02-03 DIAGNOSIS — I10 Essential (primary) hypertension: Secondary | ICD-10-CM | POA: Diagnosis not present

## 2015-02-03 DIAGNOSIS — N401 Enlarged prostate with lower urinary tract symptoms: Secondary | ICD-10-CM | POA: Diagnosis not present

## 2015-02-03 DIAGNOSIS — M1990 Unspecified osteoarthritis, unspecified site: Secondary | ICD-10-CM | POA: Diagnosis not present

## 2015-02-03 DIAGNOSIS — Z23 Encounter for immunization: Secondary | ICD-10-CM | POA: Diagnosis not present

## 2015-02-03 DIAGNOSIS — I480 Paroxysmal atrial fibrillation: Secondary | ICD-10-CM | POA: Diagnosis not present

## 2015-02-03 DIAGNOSIS — E785 Hyperlipidemia, unspecified: Secondary | ICD-10-CM | POA: Diagnosis not present

## 2015-02-03 DIAGNOSIS — R7301 Impaired fasting glucose: Secondary | ICD-10-CM | POA: Diagnosis not present

## 2015-02-03 DIAGNOSIS — M4800 Spinal stenosis, site unspecified: Secondary | ICD-10-CM | POA: Diagnosis not present

## 2015-02-14 DIAGNOSIS — H43811 Vitreous degeneration, right eye: Secondary | ICD-10-CM | POA: Diagnosis not present

## 2015-02-14 DIAGNOSIS — H26491 Other secondary cataract, right eye: Secondary | ICD-10-CM | POA: Diagnosis not present

## 2015-02-14 DIAGNOSIS — H40013 Open angle with borderline findings, low risk, bilateral: Secondary | ICD-10-CM | POA: Diagnosis not present

## 2015-02-14 DIAGNOSIS — H35033 Hypertensive retinopathy, bilateral: Secondary | ICD-10-CM | POA: Diagnosis not present

## 2015-02-14 DIAGNOSIS — Z961 Presence of intraocular lens: Secondary | ICD-10-CM | POA: Diagnosis not present

## 2015-05-20 ENCOUNTER — Other Ambulatory Visit: Payer: Self-pay | Admitting: Dermatology

## 2015-05-20 DIAGNOSIS — L570 Actinic keratosis: Secondary | ICD-10-CM | POA: Diagnosis not present

## 2015-05-20 DIAGNOSIS — C44629 Squamous cell carcinoma of skin of left upper limb, including shoulder: Secondary | ICD-10-CM | POA: Diagnosis not present

## 2015-06-05 ENCOUNTER — Other Ambulatory Visit: Payer: Self-pay | Admitting: Dermatology

## 2015-06-05 DIAGNOSIS — D485 Neoplasm of uncertain behavior of skin: Secondary | ICD-10-CM | POA: Diagnosis not present

## 2015-06-05 DIAGNOSIS — C44629 Squamous cell carcinoma of skin of left upper limb, including shoulder: Secondary | ICD-10-CM | POA: Diagnosis not present

## 2015-06-05 DIAGNOSIS — L820 Inflamed seborrheic keratosis: Secondary | ICD-10-CM | POA: Diagnosis not present

## 2015-06-26 DIAGNOSIS — E784 Other hyperlipidemia: Secondary | ICD-10-CM | POA: Diagnosis not present

## 2015-06-26 DIAGNOSIS — G43809 Other migraine, not intractable, without status migrainosus: Secondary | ICD-10-CM | POA: Diagnosis not present

## 2015-06-26 DIAGNOSIS — M1990 Unspecified osteoarthritis, unspecified site: Secondary | ICD-10-CM | POA: Diagnosis not present

## 2015-06-26 DIAGNOSIS — R7302 Impaired glucose tolerance (oral): Secondary | ICD-10-CM | POA: Diagnosis not present

## 2015-06-26 DIAGNOSIS — Z6832 Body mass index (BMI) 32.0-32.9, adult: Secondary | ICD-10-CM | POA: Diagnosis not present

## 2015-06-26 DIAGNOSIS — R945 Abnormal results of liver function studies: Secondary | ICD-10-CM | POA: Diagnosis not present

## 2015-06-26 DIAGNOSIS — Z1389 Encounter for screening for other disorder: Secondary | ICD-10-CM | POA: Diagnosis not present

## 2015-06-26 DIAGNOSIS — M4800 Spinal stenosis, site unspecified: Secondary | ICD-10-CM | POA: Diagnosis not present

## 2015-06-26 DIAGNOSIS — I10 Essential (primary) hypertension: Secondary | ICD-10-CM | POA: Diagnosis not present

## 2015-06-26 DIAGNOSIS — M2550 Pain in unspecified joint: Secondary | ICD-10-CM | POA: Diagnosis not present

## 2015-06-26 DIAGNOSIS — N401 Enlarged prostate with lower urinary tract symptoms: Secondary | ICD-10-CM | POA: Diagnosis not present

## 2015-06-26 DIAGNOSIS — I480 Paroxysmal atrial fibrillation: Secondary | ICD-10-CM | POA: Diagnosis not present

## 2015-08-07 ENCOUNTER — Encounter: Payer: Self-pay | Admitting: Cardiovascular Disease

## 2015-08-07 ENCOUNTER — Ambulatory Visit (INDEPENDENT_AMBULATORY_CARE_PROVIDER_SITE_OTHER): Payer: Medicare Other | Admitting: Cardiovascular Disease

## 2015-08-07 VITALS — BP 130/88 | HR 68 | Ht 70.0 in | Wt 223.0 lb

## 2015-08-07 DIAGNOSIS — I4892 Unspecified atrial flutter: Secondary | ICD-10-CM | POA: Diagnosis not present

## 2015-08-07 DIAGNOSIS — I480 Paroxysmal atrial fibrillation: Secondary | ICD-10-CM | POA: Diagnosis not present

## 2015-08-07 NOTE — Patient Instructions (Signed)
Medication Instructions:  STOP Aspirin   Labwork: None Ordered   Testing/Procedures: None Ordered   Follow-Up: Your physician wants you to follow-up in: 1 year with Dr. Acie Fredrickson.  You will receive a reminder letter in the mail two months in advance. If you don't receive a letter, please call our office to schedule the follow-up appointment.   If you need a refill on your cardiac medications before your next appointment, please call your pharmacy.   Thank you for choosing [facility]! Christen Bame, RN [phone]
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
## 2015-08-08 DIAGNOSIS — Z6832 Body mass index (BMI) 32.0-32.9, adult: Secondary | ICD-10-CM | POA: Diagnosis not present

## 2015-08-08 DIAGNOSIS — M75102 Unspecified rotator cuff tear or rupture of left shoulder, not specified as traumatic: Secondary | ICD-10-CM | POA: Diagnosis not present

## 2015-08-08 DIAGNOSIS — M62830 Muscle spasm of back: Secondary | ICD-10-CM | POA: Diagnosis not present

## 2015-08-08 DIAGNOSIS — I10 Essential (primary) hypertension: Secondary | ICD-10-CM | POA: Diagnosis not present

## 2015-08-08 DIAGNOSIS — M1990 Unspecified osteoarthritis, unspecified site: Secondary | ICD-10-CM | POA: Diagnosis not present

## 2015-08-20 DIAGNOSIS — M67912 Unspecified disorder of synovium and tendon, left shoulder: Secondary | ICD-10-CM | POA: Diagnosis not present

## 2015-08-25 DIAGNOSIS — M545 Low back pain: Secondary | ICD-10-CM | POA: Diagnosis not present

## 2015-09-03 DIAGNOSIS — M25512 Pain in left shoulder: Secondary | ICD-10-CM | POA: Diagnosis not present

## 2015-09-04 DIAGNOSIS — Z6832 Body mass index (BMI) 32.0-32.9, adult: Secondary | ICD-10-CM | POA: Diagnosis not present

## 2015-09-04 DIAGNOSIS — J209 Acute bronchitis, unspecified: Secondary | ICD-10-CM | POA: Diagnosis not present

## 2015-09-04 DIAGNOSIS — I10 Essential (primary) hypertension: Secondary | ICD-10-CM | POA: Diagnosis not present

## 2015-09-08 DIAGNOSIS — M25512 Pain in left shoulder: Secondary | ICD-10-CM | POA: Diagnosis not present

## 2015-09-10 DIAGNOSIS — M25512 Pain in left shoulder: Secondary | ICD-10-CM | POA: Diagnosis not present

## 2015-09-16 DIAGNOSIS — M25512 Pain in left shoulder: Secondary | ICD-10-CM | POA: Diagnosis not present

## 2015-09-18 DIAGNOSIS — M25512 Pain in left shoulder: Secondary | ICD-10-CM | POA: Diagnosis not present

## 2015-09-22 DIAGNOSIS — M25512 Pain in left shoulder: Secondary | ICD-10-CM | POA: Diagnosis not present

## 2015-09-24 DIAGNOSIS — M75122 Complete rotator cuff tear or rupture of left shoulder, not specified as traumatic: Secondary | ICD-10-CM | POA: Diagnosis not present

## 2015-09-24 DIAGNOSIS — M25512 Pain in left shoulder: Secondary | ICD-10-CM | POA: Diagnosis not present

## 2015-11-10 ENCOUNTER — Other Ambulatory Visit: Payer: Self-pay | Admitting: Dermatology

## 2015-11-10 DIAGNOSIS — D485 Neoplasm of uncertain behavior of skin: Secondary | ICD-10-CM | POA: Diagnosis not present

## 2015-11-10 DIAGNOSIS — C44319 Basal cell carcinoma of skin of other parts of face: Secondary | ICD-10-CM | POA: Diagnosis not present

## 2015-11-10 DIAGNOSIS — C4491 Basal cell carcinoma of skin, unspecified: Secondary | ICD-10-CM

## 2015-11-10 DIAGNOSIS — L820 Inflamed seborrheic keratosis: Secondary | ICD-10-CM | POA: Diagnosis not present

## 2015-11-10 HISTORY — DX: Basal cell carcinoma of skin, unspecified: C44.91

## 2015-12-19 DIAGNOSIS — I10 Essential (primary) hypertension: Secondary | ICD-10-CM | POA: Diagnosis not present

## 2015-12-19 DIAGNOSIS — R7302 Impaired glucose tolerance (oral): Secondary | ICD-10-CM | POA: Diagnosis not present

## 2015-12-19 DIAGNOSIS — E784 Other hyperlipidemia: Secondary | ICD-10-CM | POA: Diagnosis not present

## 2015-12-19 DIAGNOSIS — R8290 Unspecified abnormal findings in urine: Secondary | ICD-10-CM | POA: Diagnosis not present

## 2015-12-19 DIAGNOSIS — Z125 Encounter for screening for malignant neoplasm of prostate: Secondary | ICD-10-CM | POA: Diagnosis not present

## 2015-12-24 DIAGNOSIS — M2550 Pain in unspecified joint: Secondary | ICD-10-CM | POA: Diagnosis not present

## 2015-12-24 DIAGNOSIS — M62830 Muscle spasm of back: Secondary | ICD-10-CM | POA: Diagnosis not present

## 2015-12-24 DIAGNOSIS — M4800 Spinal stenosis, site unspecified: Secondary | ICD-10-CM | POA: Diagnosis not present

## 2015-12-24 DIAGNOSIS — Z0000 Encounter for general adult medical examination without abnormal findings: Secondary | ICD-10-CM | POA: Diagnosis not present

## 2015-12-24 DIAGNOSIS — N401 Enlarged prostate with lower urinary tract symptoms: Secondary | ICD-10-CM | POA: Diagnosis not present

## 2015-12-24 DIAGNOSIS — I10 Essential (primary) hypertension: Secondary | ICD-10-CM | POA: Diagnosis not present

## 2015-12-24 DIAGNOSIS — M1990 Unspecified osteoarthritis, unspecified site: Secondary | ICD-10-CM | POA: Diagnosis not present

## 2015-12-24 DIAGNOSIS — Z1389 Encounter for screening for other disorder: Secondary | ICD-10-CM | POA: Diagnosis not present

## 2015-12-24 DIAGNOSIS — G959 Disease of spinal cord, unspecified: Secondary | ICD-10-CM | POA: Diagnosis not present

## 2015-12-24 DIAGNOSIS — G43809 Other migraine, not intractable, without status migrainosus: Secondary | ICD-10-CM | POA: Diagnosis not present

## 2015-12-24 DIAGNOSIS — Z6833 Body mass index (BMI) 33.0-33.9, adult: Secondary | ICD-10-CM | POA: Diagnosis not present

## 2015-12-24 DIAGNOSIS — I480 Paroxysmal atrial fibrillation: Secondary | ICD-10-CM | POA: Diagnosis not present

## 2015-12-25 ENCOUNTER — Other Ambulatory Visit: Payer: Self-pay | Admitting: Dermatology

## 2015-12-25 DIAGNOSIS — C44319 Basal cell carcinoma of skin of other parts of face: Secondary | ICD-10-CM | POA: Diagnosis not present

## 2015-12-25 DIAGNOSIS — L988 Other specified disorders of the skin and subcutaneous tissue: Secondary | ICD-10-CM | POA: Diagnosis not present

## 2015-12-30 DIAGNOSIS — Z1212 Encounter for screening for malignant neoplasm of rectum: Secondary | ICD-10-CM | POA: Diagnosis not present

## 2015-12-31 DIAGNOSIS — C44319 Basal cell carcinoma of skin of other parts of face: Secondary | ICD-10-CM | POA: Diagnosis not present

## 2016-03-22 DIAGNOSIS — Z23 Encounter for immunization: Secondary | ICD-10-CM | POA: Diagnosis not present

## 2016-04-20 ENCOUNTER — Other Ambulatory Visit: Payer: Self-pay | Admitting: Dermatology

## 2016-04-20 DIAGNOSIS — D0439 Carcinoma in situ of skin of other parts of face: Secondary | ICD-10-CM | POA: Diagnosis not present

## 2016-04-20 DIAGNOSIS — C4492 Squamous cell carcinoma of skin, unspecified: Secondary | ICD-10-CM

## 2016-04-20 HISTORY — DX: Squamous cell carcinoma of skin, unspecified: C44.92

## 2016-06-04 DIAGNOSIS — H40013 Open angle with borderline findings, low risk, bilateral: Secondary | ICD-10-CM | POA: Diagnosis not present

## 2016-06-04 DIAGNOSIS — H35033 Hypertensive retinopathy, bilateral: Secondary | ICD-10-CM | POA: Diagnosis not present

## 2016-06-04 DIAGNOSIS — H35363 Drusen (degenerative) of macula, bilateral: Secondary | ICD-10-CM | POA: Diagnosis not present

## 2016-06-04 DIAGNOSIS — Z961 Presence of intraocular lens: Secondary | ICD-10-CM | POA: Diagnosis not present

## 2016-07-29 ENCOUNTER — Encounter: Payer: Self-pay | Admitting: Cardiovascular Disease

## 2016-08-10 ENCOUNTER — Ambulatory Visit: Payer: Medicare Other | Admitting: Cardiovascular Disease

## 2016-08-11 ENCOUNTER — Ambulatory Visit: Payer: Medicare Other | Admitting: Cardiovascular Disease

## 2016-08-12 DIAGNOSIS — Z6831 Body mass index (BMI) 31.0-31.9, adult: Secondary | ICD-10-CM | POA: Diagnosis not present

## 2016-08-12 DIAGNOSIS — M7989 Other specified soft tissue disorders: Secondary | ICD-10-CM | POA: Diagnosis not present

## 2016-08-12 DIAGNOSIS — M66822 Spontaneous rupture of other tendons, left upper arm: Secondary | ICD-10-CM | POA: Diagnosis not present

## 2016-08-13 ENCOUNTER — Ambulatory Visit (INDEPENDENT_AMBULATORY_CARE_PROVIDER_SITE_OTHER): Payer: Medicare Other | Admitting: Cardiovascular Disease

## 2016-08-13 ENCOUNTER — Encounter: Payer: Self-pay | Admitting: Cardiovascular Disease

## 2016-08-13 VITALS — BP 140/86 | HR 52 | Ht 70.0 in | Wt 215.4 lb

## 2016-08-13 DIAGNOSIS — I4892 Unspecified atrial flutter: Secondary | ICD-10-CM | POA: Diagnosis not present

## 2016-08-13 DIAGNOSIS — I480 Paroxysmal atrial fibrillation: Secondary | ICD-10-CM | POA: Insufficient documentation

## 2016-08-13 NOTE — Patient Instructions (Addendum)
Medication Instructions:    Your physician recommends that you continue on your current medications as directed. Please refer to the Current Medication list given to you today.  --- If you need a refill on your cardiac medications before your next appointment, please call your pharmacy. ---  Labwork:  None ordered  Testing/Procedures:  None ordered  Follow-Up:  Your physician wants you to follow-up in: 1 year with Dr. Acie Fredrickson.  You will receive a reminder letter in the mail two months in advance. If you don't receive a letter, please call our office to schedule the follow-up appointment.  Thank you for choosing [facility]!!
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
## 2016-09-14 ENCOUNTER — Ambulatory Visit: Payer: Medicare Other | Admitting: Cardiovascular Disease

## 2016-09-17 ENCOUNTER — Ambulatory Visit: Payer: Medicare Other | Admitting: Cardiovascular Disease

## 2016-12-16 DIAGNOSIS — H40013 Open angle with borderline findings, low risk, bilateral: Secondary | ICD-10-CM | POA: Diagnosis not present

## 2016-12-21 DIAGNOSIS — E784 Other hyperlipidemia: Secondary | ICD-10-CM | POA: Diagnosis not present

## 2016-12-21 DIAGNOSIS — R8290 Unspecified abnormal findings in urine: Secondary | ICD-10-CM | POA: Diagnosis not present

## 2016-12-21 DIAGNOSIS — Z125 Encounter for screening for malignant neoplasm of prostate: Secondary | ICD-10-CM | POA: Diagnosis not present

## 2016-12-21 DIAGNOSIS — R7302 Impaired glucose tolerance (oral): Secondary | ICD-10-CM | POA: Diagnosis not present

## 2016-12-21 DIAGNOSIS — I10 Essential (primary) hypertension: Secondary | ICD-10-CM | POA: Diagnosis not present

## 2016-12-27 DIAGNOSIS — Z1389 Encounter for screening for other disorder: Secondary | ICD-10-CM | POA: Diagnosis not present

## 2016-12-27 DIAGNOSIS — R7302 Impaired glucose tolerance (oral): Secondary | ICD-10-CM | POA: Diagnosis not present

## 2016-12-27 DIAGNOSIS — I10 Essential (primary) hypertension: Secondary | ICD-10-CM | POA: Diagnosis not present

## 2016-12-27 DIAGNOSIS — I480 Paroxysmal atrial fibrillation: Secondary | ICD-10-CM | POA: Diagnosis not present

## 2016-12-27 DIAGNOSIS — M4800 Spinal stenosis, site unspecified: Secondary | ICD-10-CM | POA: Diagnosis not present

## 2016-12-27 DIAGNOSIS — M2550 Pain in unspecified joint: Secondary | ICD-10-CM | POA: Diagnosis not present

## 2016-12-27 DIAGNOSIS — M1990 Unspecified osteoarthritis, unspecified site: Secondary | ICD-10-CM | POA: Diagnosis not present

## 2016-12-27 DIAGNOSIS — Z1212 Encounter for screening for malignant neoplasm of rectum: Secondary | ICD-10-CM | POA: Diagnosis not present

## 2016-12-27 DIAGNOSIS — Z23 Encounter for immunization: Secondary | ICD-10-CM | POA: Diagnosis not present

## 2016-12-27 DIAGNOSIS — Z0000 Encounter for general adult medical examination without abnormal findings: Secondary | ICD-10-CM | POA: Diagnosis not present

## 2016-12-27 DIAGNOSIS — E784 Other hyperlipidemia: Secondary | ICD-10-CM | POA: Diagnosis not present

## 2016-12-27 DIAGNOSIS — Z6832 Body mass index (BMI) 32.0-32.9, adult: Secondary | ICD-10-CM | POA: Diagnosis not present

## 2017-03-08 DIAGNOSIS — Z23 Encounter for immunization: Secondary | ICD-10-CM | POA: Diagnosis not present

## 2017-06-15 DIAGNOSIS — H40013 Open angle with borderline findings, low risk, bilateral: Secondary | ICD-10-CM | POA: Diagnosis not present

## 2017-06-15 DIAGNOSIS — H35033 Hypertensive retinopathy, bilateral: Secondary | ICD-10-CM | POA: Diagnosis not present

## 2017-06-15 DIAGNOSIS — Z961 Presence of intraocular lens: Secondary | ICD-10-CM | POA: Diagnosis not present

## 2017-06-15 DIAGNOSIS — H35363 Drusen (degenerative) of macula, bilateral: Secondary | ICD-10-CM | POA: Diagnosis not present

## 2017-06-27 DIAGNOSIS — M1990 Unspecified osteoarthritis, unspecified site: Secondary | ICD-10-CM | POA: Diagnosis not present

## 2017-06-27 DIAGNOSIS — G959 Disease of spinal cord, unspecified: Secondary | ICD-10-CM | POA: Diagnosis not present

## 2017-06-27 DIAGNOSIS — R7302 Impaired glucose tolerance (oral): Secondary | ICD-10-CM | POA: Diagnosis not present

## 2017-06-27 DIAGNOSIS — Z1389 Encounter for screening for other disorder: Secondary | ICD-10-CM | POA: Diagnosis not present

## 2017-06-27 DIAGNOSIS — I10 Essential (primary) hypertension: Secondary | ICD-10-CM | POA: Diagnosis not present

## 2017-06-27 DIAGNOSIS — I480 Paroxysmal atrial fibrillation: Secondary | ICD-10-CM | POA: Diagnosis not present

## 2017-06-27 DIAGNOSIS — M4800 Spinal stenosis, site unspecified: Secondary | ICD-10-CM | POA: Diagnosis not present

## 2017-06-27 DIAGNOSIS — Z6831 Body mass index (BMI) 31.0-31.9, adult: Secondary | ICD-10-CM | POA: Diagnosis not present

## 2017-08-12 ENCOUNTER — Ambulatory Visit (INDEPENDENT_AMBULATORY_CARE_PROVIDER_SITE_OTHER): Payer: Medicare Other | Admitting: Physician Assistant

## 2017-08-12 ENCOUNTER — Encounter: Payer: Self-pay | Admitting: Physician Assistant

## 2017-08-12 VITALS — BP 132/70 | HR 49 | Ht 70.0 in | Wt 210.2 lb

## 2017-08-12 DIAGNOSIS — I10 Essential (primary) hypertension: Secondary | ICD-10-CM | POA: Insufficient documentation

## 2017-08-12 DIAGNOSIS — R001 Bradycardia, unspecified: Secondary | ICD-10-CM

## 2017-08-12 DIAGNOSIS — I4892 Unspecified atrial flutter: Secondary | ICD-10-CM | POA: Diagnosis not present

## 2017-08-12 LAB — BASIC METABOLIC PANEL
BUN/Creatinine Ratio: 20 (ref 10–24)
BUN: 25 mg/dL (ref 8–27)
CO2: 23 mmol/L (ref 20–29)
CREATININE: 1.24 mg/dL (ref 0.76–1.27)
Calcium: 9.1 mg/dL (ref 8.6–10.2)
Chloride: 99 mmol/L (ref 96–106)
GFR, EST AFRICAN AMERICAN: 60 mL/min/{1.73_m2} (ref >59)
GFR, EST NON AFRICAN AMERICAN: 52 mL/min/{1.73_m2} — AB (ref >59)
Glucose: 76 mg/dL (ref 65–99)
Potassium: 4.5 mmol/L (ref 3.5–5.2)
Sodium: 136 mmol/L (ref 134–144)

## 2017-08-12 LAB — CBC
Hematocrit: 42.6 % (ref 37.5–51.0)
Hemoglobin: 14.9 g/dL (ref 13.0–17.7)
MCH: 36.4 pg — AB (ref 26.6–33.0)
MCHC: 35 g/dL (ref 31.5–35.7)
MCV: 104 fL — AB (ref 79–97)
Platelets: 240 10*3/uL (ref 150–379)
RBC: 4.09 x10E6/uL — AB (ref 4.14–5.80)
RDW: 13.6 % (ref 12.3–15.4)
WBC: 5.3 10*3/uL (ref 3.4–10.8)

## 2017-08-12 NOTE — Patient Instructions (Signed)
Medication Instructions:  No changes  Labwork: Today - BMET, CBC  Testing/Procedures: None   Follow-Up: Mertie Moores, MD or Richardson Dopp, PA-C in 1 year  Any Other Special Instructions Will Be Listed Below (If Applicable).  If you need a refill on your cardiac medications before your next appointment, please call your pharmacy.
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
## 2017-08-23 DIAGNOSIS — M533 Sacrococcygeal disorders, not elsewhere classified: Secondary | ICD-10-CM | POA: Diagnosis not present

## 2017-08-24 DIAGNOSIS — M533 Sacrococcygeal disorders, not elsewhere classified: Secondary | ICD-10-CM | POA: Diagnosis not present

## 2017-09-07 DIAGNOSIS — M545 Low back pain: Secondary | ICD-10-CM | POA: Diagnosis not present

## 2017-09-12 DIAGNOSIS — M48061 Spinal stenosis, lumbar region without neurogenic claudication: Secondary | ICD-10-CM | POA: Diagnosis not present

## 2017-10-31 ENCOUNTER — Other Ambulatory Visit: Payer: Self-pay | Admitting: Dermatology

## 2017-10-31 DIAGNOSIS — D0422 Carcinoma in situ of skin of left ear and external auricular canal: Secondary | ICD-10-CM | POA: Diagnosis not present

## 2017-10-31 DIAGNOSIS — L570 Actinic keratosis: Secondary | ICD-10-CM | POA: Diagnosis not present

## 2017-10-31 DIAGNOSIS — C44319 Basal cell carcinoma of skin of other parts of face: Secondary | ICD-10-CM | POA: Diagnosis not present

## 2017-11-25 DIAGNOSIS — K529 Noninfective gastroenteritis and colitis, unspecified: Secondary | ICD-10-CM | POA: Diagnosis not present

## 2017-11-25 DIAGNOSIS — R109 Unspecified abdominal pain: Secondary | ICD-10-CM | POA: Diagnosis not present

## 2017-11-25 DIAGNOSIS — I10 Essential (primary) hypertension: Secondary | ICD-10-CM | POA: Diagnosis not present

## 2017-11-25 DIAGNOSIS — Z6831 Body mass index (BMI) 31.0-31.9, adult: Secondary | ICD-10-CM | POA: Diagnosis not present

## 2017-11-25 DIAGNOSIS — R197 Diarrhea, unspecified: Secondary | ICD-10-CM | POA: Diagnosis not present

## 2017-11-28 ENCOUNTER — Ambulatory Visit
Admission: RE | Admit: 2017-11-28 | Discharge: 2017-11-28 | Disposition: A | Discharge status: Home / Self Care | Payer: Medicare Other | Source: Ambulatory Visit | Attending: Internal Medicine | Admitting: Internal Medicine

## 2017-11-28 ENCOUNTER — Other Ambulatory Visit: Payer: Self-pay | Admitting: Internal Medicine

## 2017-11-28 DIAGNOSIS — R197 Diarrhea, unspecified: Secondary | ICD-10-CM

## 2017-11-28 DIAGNOSIS — K5730 Diverticulosis of large intestine without perforation or abscess without bleeding: Secondary | ICD-10-CM | POA: Diagnosis not present

## 2017-11-28 DIAGNOSIS — R1084 Generalized abdominal pain: Secondary | ICD-10-CM

## 2017-11-28 MED ORDER — IOPAMIDOL (ISOVUE-300) INJECTION 61%
100.0000 mL | Freq: Once | INTRAVENOUS | Status: AC | PRN
  Administered 2017-11-28: 100 mL via INTRAVENOUS

## 2017-11-29 ENCOUNTER — Other Ambulatory Visit: Payer: Self-pay | Admitting: Internal Medicine

## 2017-11-29 DIAGNOSIS — R197 Diarrhea, unspecified: Secondary | ICD-10-CM

## 2017-11-29 DIAGNOSIS — R1084 Generalized abdominal pain: Secondary | ICD-10-CM

## 2018-01-06 DIAGNOSIS — R82998 Other abnormal findings in urine: Secondary | ICD-10-CM | POA: Diagnosis not present

## 2018-01-06 DIAGNOSIS — R7302 Impaired glucose tolerance (oral): Secondary | ICD-10-CM | POA: Diagnosis not present

## 2018-01-06 DIAGNOSIS — Z125 Encounter for screening for malignant neoplasm of prostate: Secondary | ICD-10-CM | POA: Diagnosis not present

## 2018-01-06 DIAGNOSIS — I10 Essential (primary) hypertension: Secondary | ICD-10-CM | POA: Diagnosis not present

## 2018-01-06 DIAGNOSIS — E7849 Other hyperlipidemia: Secondary | ICD-10-CM | POA: Diagnosis not present

## 2018-01-11 DIAGNOSIS — N401 Enlarged prostate with lower urinary tract symptoms: Secondary | ICD-10-CM | POA: Diagnosis not present

## 2018-01-11 DIAGNOSIS — M4800 Spinal stenosis, site unspecified: Secondary | ICD-10-CM | POA: Diagnosis not present

## 2018-01-11 DIAGNOSIS — I480 Paroxysmal atrial fibrillation: Secondary | ICD-10-CM | POA: Diagnosis not present

## 2018-01-11 DIAGNOSIS — G43809 Other migraine, not intractable, without status migrainosus: Secondary | ICD-10-CM | POA: Diagnosis not present

## 2018-01-11 DIAGNOSIS — E669 Obesity, unspecified: Secondary | ICD-10-CM | POA: Diagnosis not present

## 2018-01-11 DIAGNOSIS — Z23 Encounter for immunization: Secondary | ICD-10-CM | POA: Diagnosis not present

## 2018-01-11 DIAGNOSIS — Z6830 Body mass index (BMI) 30.0-30.9, adult: Secondary | ICD-10-CM | POA: Diagnosis not present

## 2018-01-11 DIAGNOSIS — R7302 Impaired glucose tolerance (oral): Secondary | ICD-10-CM | POA: Diagnosis not present

## 2018-01-11 DIAGNOSIS — I10 Essential (primary) hypertension: Secondary | ICD-10-CM | POA: Diagnosis not present

## 2018-01-11 DIAGNOSIS — M62830 Muscle spasm of back: Secondary | ICD-10-CM | POA: Diagnosis not present

## 2018-01-11 DIAGNOSIS — Z0000 Encounter for general adult medical examination without abnormal findings: Secondary | ICD-10-CM | POA: Diagnosis not present

## 2018-01-11 DIAGNOSIS — E7849 Other hyperlipidemia: Secondary | ICD-10-CM | POA: Diagnosis not present

## 2018-01-13 DIAGNOSIS — Z1212 Encounter for screening for malignant neoplasm of rectum: Secondary | ICD-10-CM | POA: Diagnosis not present

## 2018-01-30 DIAGNOSIS — C44319 Basal cell carcinoma of skin of other parts of face: Secondary | ICD-10-CM | POA: Diagnosis not present

## 2018-04-05 DIAGNOSIS — I10 Essential (primary) hypertension: Secondary | ICD-10-CM | POA: Diagnosis not present

## 2018-04-05 DIAGNOSIS — Z6831 Body mass index (BMI) 31.0-31.9, adult: Secondary | ICD-10-CM | POA: Diagnosis not present

## 2018-04-05 DIAGNOSIS — R1033 Periumbilical pain: Secondary | ICD-10-CM | POA: Diagnosis not present

## 2018-06-01 DIAGNOSIS — D0422 Carcinoma in situ of skin of left ear and external auricular canal: Secondary | ICD-10-CM | POA: Diagnosis not present

## 2018-06-12 DIAGNOSIS — Z6831 Body mass index (BMI) 31.0-31.9, adult: Secondary | ICD-10-CM | POA: Diagnosis not present

## 2018-06-12 DIAGNOSIS — R0609 Other forms of dyspnea: Secondary | ICD-10-CM | POA: Diagnosis not present

## 2018-06-12 DIAGNOSIS — J069 Acute upper respiratory infection, unspecified: Secondary | ICD-10-CM | POA: Diagnosis not present

## 2018-06-12 DIAGNOSIS — I10 Essential (primary) hypertension: Secondary | ICD-10-CM | POA: Diagnosis not present

## 2018-06-12 DIAGNOSIS — R05 Cough: Secondary | ICD-10-CM | POA: Diagnosis not present

## 2018-06-20 DIAGNOSIS — H35363 Drusen (degenerative) of macula, bilateral: Secondary | ICD-10-CM | POA: Diagnosis not present

## 2018-06-20 DIAGNOSIS — Z961 Presence of intraocular lens: Secondary | ICD-10-CM | POA: Diagnosis not present

## 2018-06-20 DIAGNOSIS — H35033 Hypertensive retinopathy, bilateral: Secondary | ICD-10-CM | POA: Diagnosis not present

## 2018-06-20 DIAGNOSIS — H40013 Open angle with borderline findings, low risk, bilateral: Secondary | ICD-10-CM | POA: Diagnosis not present

## 2018-06-23 DIAGNOSIS — I10 Essential (primary) hypertension: Secondary | ICD-10-CM | POA: Diagnosis not present

## 2018-06-23 DIAGNOSIS — J209 Acute bronchitis, unspecified: Secondary | ICD-10-CM | POA: Diagnosis not present

## 2018-06-23 DIAGNOSIS — Z6831 Body mass index (BMI) 31.0-31.9, adult: Secondary | ICD-10-CM | POA: Diagnosis not present

## 2018-06-23 DIAGNOSIS — J069 Acute upper respiratory infection, unspecified: Secondary | ICD-10-CM | POA: Diagnosis not present

## 2018-07-03 ENCOUNTER — Encounter: Payer: Self-pay | Admitting: Cardiovascular Disease

## 2018-07-31 ENCOUNTER — Encounter: Payer: Self-pay | Admitting: Cardiovascular Disease

## 2018-07-31 ENCOUNTER — Other Ambulatory Visit: Payer: Self-pay

## 2018-07-31 ENCOUNTER — Ambulatory Visit (INDEPENDENT_AMBULATORY_CARE_PROVIDER_SITE_OTHER): Payer: Medicare Other | Admitting: Cardiovascular Disease

## 2018-07-31 VITALS — BP 134/70 | HR 63 | Ht 70.0 in | Wt 214.4 lb

## 2018-07-31 DIAGNOSIS — I10 Essential (primary) hypertension: Secondary | ICD-10-CM

## 2018-07-31 DIAGNOSIS — I480 Paroxysmal atrial fibrillation: Secondary | ICD-10-CM
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
## 2018-07-31 NOTE — Patient Instructions (Signed)

## 2018-08-10 ENCOUNTER — Encounter: Payer: Self-pay | Admitting: *Deleted

## 2018-09-07 ENCOUNTER — Other Ambulatory Visit: Payer: Self-pay

## 2018-09-07 ENCOUNTER — Encounter (HOSPITAL_BASED_OUTPATIENT_CLINIC_OR_DEPARTMENT_OTHER): Payer: Medicare Other | Attending: Internal Medicine

## 2018-09-07 DIAGNOSIS — I87311 Chronic venous hypertension (idiopathic) with ulcer of right lower extremity: Secondary | ICD-10-CM | POA: Insufficient documentation

## 2018-09-07 DIAGNOSIS — Z791 Long term (current) use of non-steroidal anti-inflammatories (NSAID): Secondary | ICD-10-CM | POA: Insufficient documentation

## 2018-09-07 DIAGNOSIS — Z7901 Long term (current) use of anticoagulants: Secondary | ICD-10-CM | POA: Diagnosis not present

## 2018-09-07 DIAGNOSIS — Z87891 Personal history of nicotine dependence: Secondary | ICD-10-CM | POA: Insufficient documentation

## 2018-09-07 DIAGNOSIS — S81801A Unspecified open wound, right lower leg, initial encounter: Secondary | ICD-10-CM | POA: Diagnosis not present

## 2018-09-07 DIAGNOSIS — Z85828 Personal history of other malignant neoplasm of skin: Secondary | ICD-10-CM | POA: Insufficient documentation

## 2018-09-07 DIAGNOSIS — L97812 Non-pressure chronic ulcer of other part of right lower leg with fat layer exposed: Secondary | ICD-10-CM | POA: Diagnosis not present

## 2018-09-07 DIAGNOSIS — I4891 Unspecified atrial fibrillation: Secondary | ICD-10-CM | POA: Diagnosis not present

## 2018-09-07 DIAGNOSIS — I10 Essential (primary) hypertension: Secondary | ICD-10-CM | POA: Insufficient documentation

## 2018-09-21 ENCOUNTER — Encounter (HOSPITAL_BASED_OUTPATIENT_CLINIC_OR_DEPARTMENT_OTHER): Payer: Medicare Other | Attending: Internal Medicine

## 2018-09-21 DIAGNOSIS — Z85828 Personal history of other malignant neoplasm of skin: Secondary | ICD-10-CM | POA: Insufficient documentation

## 2018-09-21 DIAGNOSIS — S81801A Unspecified open wound, right lower leg, initial encounter: Secondary | ICD-10-CM | POA: Diagnosis not present

## 2018-09-21 DIAGNOSIS — L97812 Non-pressure chronic ulcer of other part of right lower leg with fat layer exposed: Secondary | ICD-10-CM | POA: Diagnosis not present

## 2018-09-21 DIAGNOSIS — I87311 Chronic venous hypertension (idiopathic) with ulcer of right lower extremity: Secondary | ICD-10-CM | POA: Insufficient documentation

## 2018-09-21 DIAGNOSIS — I10 Essential (primary) hypertension: Secondary | ICD-10-CM | POA: Diagnosis not present

## 2018-10-05 DIAGNOSIS — I10 Essential (primary) hypertension: Secondary | ICD-10-CM | POA: Diagnosis not present

## 2018-10-05 DIAGNOSIS — L97812 Non-pressure chronic ulcer of other part of right lower leg with fat layer exposed: Secondary | ICD-10-CM | POA: Diagnosis not present

## 2018-10-05 DIAGNOSIS — L97819 Non-pressure chronic ulcer of other part of right lower leg with unspecified severity: Secondary | ICD-10-CM | POA: Diagnosis not present

## 2018-10-05 DIAGNOSIS — Z85828 Personal history of other malignant neoplasm of skin: Secondary | ICD-10-CM | POA: Diagnosis not present

## 2018-10-05 DIAGNOSIS — I87311 Chronic venous hypertension (idiopathic) with ulcer of right lower extremity: Secondary | ICD-10-CM | POA: Diagnosis not present

## 2018-10-19 ENCOUNTER — Encounter (HOSPITAL_BASED_OUTPATIENT_CLINIC_OR_DEPARTMENT_OTHER): Payer: Medicare Other | Attending: Internal Medicine

## 2018-11-10 DIAGNOSIS — I10 Essential (primary) hypertension: Secondary | ICD-10-CM | POA: Diagnosis not present

## 2018-11-10 DIAGNOSIS — L989 Disorder of the skin and subcutaneous tissue, unspecified: Secondary | ICD-10-CM | POA: Diagnosis not present

## 2018-11-14 ENCOUNTER — Other Ambulatory Visit: Payer: Self-pay | Admitting: Dermatology

## 2018-11-14 DIAGNOSIS — I872 Venous insufficiency (chronic) (peripheral): Secondary | ICD-10-CM | POA: Diagnosis not present

## 2018-11-14 DIAGNOSIS — L821 Other seborrheic keratosis: Secondary | ICD-10-CM | POA: Diagnosis not present

## 2018-11-14 DIAGNOSIS — D0472 Carcinoma in situ of skin of left lower limb, including hip: Secondary | ICD-10-CM | POA: Diagnosis not present

## 2018-11-14 DIAGNOSIS — D485 Neoplasm of uncertain behavior of skin: Secondary | ICD-10-CM | POA: Diagnosis not present

## 2018-12-13 DIAGNOSIS — Z029 Encounter for administrative examinations, unspecified: Secondary | ICD-10-CM | POA: Diagnosis not present

## 2019-01-01 DIAGNOSIS — R972 Elevated prostate specific antigen [PSA]: Secondary | ICD-10-CM | POA: Diagnosis not present

## 2019-01-01 DIAGNOSIS — R310 Gross hematuria: Secondary | ICD-10-CM | POA: Diagnosis not present

## 2019-01-01 DIAGNOSIS — R3121 Asymptomatic microscopic hematuria: Secondary | ICD-10-CM | POA: Diagnosis not present

## 2019-01-09 DIAGNOSIS — Z23 Encounter for immunization: Secondary | ICD-10-CM | POA: Diagnosis not present

## 2019-01-09 DIAGNOSIS — I10 Essential (primary) hypertension: Secondary | ICD-10-CM | POA: Diagnosis not present

## 2019-01-09 DIAGNOSIS — R7302 Impaired glucose tolerance (oral): Secondary | ICD-10-CM | POA: Diagnosis not present

## 2019-01-09 DIAGNOSIS — Z125 Encounter for screening for malignant neoplasm of prostate: Secondary | ICD-10-CM | POA: Diagnosis not present

## 2019-01-09 DIAGNOSIS — E7849 Other hyperlipidemia: Secondary | ICD-10-CM | POA: Diagnosis not present

## 2019-01-10 DIAGNOSIS — R310 Gross hematuria: Secondary | ICD-10-CM | POA: Diagnosis not present

## 2019-01-10 DIAGNOSIS — N400 Enlarged prostate without lower urinary tract symptoms: Secondary | ICD-10-CM | POA: Diagnosis not present

## 2019-01-10 DIAGNOSIS — N3289 Other specified disorders of bladder: Secondary | ICD-10-CM | POA: Diagnosis not present

## 2019-01-11 DIAGNOSIS — R82998 Other abnormal findings in urine: Secondary | ICD-10-CM | POA: Diagnosis not present

## 2019-01-15 DIAGNOSIS — G43809 Other migraine, not intractable, without status migrainosus: Secondary | ICD-10-CM | POA: Diagnosis not present

## 2019-01-15 DIAGNOSIS — M1990 Unspecified osteoarthritis, unspecified site: Secondary | ICD-10-CM | POA: Diagnosis not present

## 2019-01-15 DIAGNOSIS — Z0000 Encounter for general adult medical examination without abnormal findings: Secondary | ICD-10-CM | POA: Diagnosis not present

## 2019-01-15 DIAGNOSIS — M62830 Muscle spasm of back: Secondary | ICD-10-CM | POA: Diagnosis not present

## 2019-01-15 DIAGNOSIS — I10 Essential (primary) hypertension: Secondary | ICD-10-CM | POA: Diagnosis not present

## 2019-01-15 DIAGNOSIS — G959 Disease of spinal cord, unspecified: Secondary | ICD-10-CM | POA: Diagnosis not present

## 2019-01-15 DIAGNOSIS — I480 Paroxysmal atrial fibrillation: Secondary | ICD-10-CM | POA: Diagnosis not present

## 2019-01-15 DIAGNOSIS — N400 Enlarged prostate without lower urinary tract symptoms: Secondary | ICD-10-CM | POA: Diagnosis not present

## 2019-01-15 DIAGNOSIS — M75102 Unspecified rotator cuff tear or rupture of left shoulder, not specified as traumatic: Secondary | ICD-10-CM | POA: Diagnosis not present

## 2019-01-15 DIAGNOSIS — M2550 Pain in unspecified joint: Secondary | ICD-10-CM | POA: Diagnosis not present

## 2019-01-15 DIAGNOSIS — E785 Hyperlipidemia, unspecified: Secondary | ICD-10-CM | POA: Diagnosis not present

## 2019-01-15 DIAGNOSIS — R972 Elevated prostate specific antigen [PSA]: Secondary | ICD-10-CM | POA: Diagnosis not present

## 2019-01-15 DIAGNOSIS — R310 Gross hematuria: Secondary | ICD-10-CM | POA: Diagnosis not present

## 2019-01-15 DIAGNOSIS — M4800 Spinal stenosis, site unspecified: Secondary | ICD-10-CM | POA: Diagnosis not present

## 2019-01-15 DIAGNOSIS — R7302 Impaired glucose tolerance (oral): Secondary | ICD-10-CM | POA: Diagnosis not present

## 2019-01-17 ENCOUNTER — Other Ambulatory Visit (HOSPITAL_COMMUNITY): Payer: Self-pay | Admitting: Internal Medicine

## 2019-01-17 ENCOUNTER — Other Ambulatory Visit: Payer: Self-pay | Admitting: Internal Medicine

## 2019-01-17 DIAGNOSIS — G43809 Other migraine, not intractable, without status migrainosus: Secondary | ICD-10-CM | POA: Diagnosis not present

## 2019-01-17 DIAGNOSIS — R519 Headache, unspecified: Secondary | ICD-10-CM

## 2019-01-17 DIAGNOSIS — I10 Essential (primary) hypertension: Secondary | ICD-10-CM | POA: Diagnosis not present

## 2019-01-17 DIAGNOSIS — R51 Headache: Secondary | ICD-10-CM | POA: Diagnosis not present

## 2019-01-17 DIAGNOSIS — M2550 Pain in unspecified joint: Secondary | ICD-10-CM | POA: Diagnosis not present

## 2019-01-17 DIAGNOSIS — H5711 Ocular pain, right eye: Secondary | ICD-10-CM | POA: Diagnosis not present

## 2019-01-17 DIAGNOSIS — Z7901 Long term (current) use of anticoagulants: Secondary | ICD-10-CM | POA: Diagnosis not present

## 2019-01-17 DIAGNOSIS — I480 Paroxysmal atrial fibrillation: Secondary | ICD-10-CM | POA: Diagnosis not present

## 2019-01-18 ENCOUNTER — Ambulatory Visit (HOSPITAL_COMMUNITY)
Admission: RE | Admit: 2019-01-18 | Discharge: 2019-01-18 | Disposition: A | Discharge status: Home / Self Care | Payer: Medicare Other | Source: Ambulatory Visit | Attending: Internal Medicine | Admitting: Internal Medicine

## 2019-01-18 ENCOUNTER — Other Ambulatory Visit (HOSPITAL_COMMUNITY): Payer: Self-pay | Admitting: Internal Medicine

## 2019-01-18 ENCOUNTER — Other Ambulatory Visit: Payer: Self-pay

## 2019-01-18 DIAGNOSIS — R51 Headache: Secondary | ICD-10-CM | POA: Insufficient documentation

## 2019-01-18 DIAGNOSIS — R519 Headache, unspecified: Secondary | ICD-10-CM

## 2019-01-18 DIAGNOSIS — H5711 Ocular pain, right eye: Secondary | ICD-10-CM

## 2019-01-18 MED ORDER — GADOBUTROL 1 MMOL/ML IV SOLN
8.0000 mL | Freq: Once | INTRAVENOUS | Status: AC | PRN
  Administered 2019-01-18: 16:00:00 8 mL via INTRAVENOUS

## 2019-02-02 DIAGNOSIS — Z1212 Encounter for screening for malignant neoplasm of rectum: Secondary | ICD-10-CM | POA: Diagnosis not present

## 2019-04-02 ENCOUNTER — Other Ambulatory Visit: Payer: Self-pay | Admitting: Dermatology

## 2019-04-02 DIAGNOSIS — D485 Neoplasm of uncertain behavior of skin: Secondary | ICD-10-CM | POA: Diagnosis not present

## 2019-04-02 DIAGNOSIS — D0439 Carcinoma in situ of skin of other parts of face: Secondary | ICD-10-CM | POA: Diagnosis not present

## 2019-06-06 ENCOUNTER — Ambulatory Visit: Payer: Medicare Other | Attending: Internal Medicine

## 2019-06-06 DIAGNOSIS — Z23 Encounter for immunization: Secondary | ICD-10-CM | POA: Diagnosis not present

## 2019-06-06 NOTE — Progress Notes (Signed)
   Covid-19 Vaccination Clinic  Name:  Ke[place]and Current    MRN: [id number] DOB: 25-Nov-1927  06/06/2019  Mr. Pretorius was observed post Covid-19 immunization for 15 minutes without incidence. He was provided with Vaccine Information Sheet and instruction to access the V-Safe system.   Mr. Froggatt was instructed to call 911 with any severe reactions post vaccine: Marland Kitchen Difficulty breathing  . Swelling of your face and throat  . A fast heartbeat  . A bad rash all over your body  . Dizziness and weakness    Immunizations Administered    Name Date Dose VIS Date Route   Pfizer COVID-19 Vaccine 06/06/2019  8:54 AM 0.3 mL 04/27/2019 Intramuscular   Manufacturer: [place]   Lot: [id number]   [place]: [id number]

## 2019-06-20 DIAGNOSIS — L570 Actinic keratosis: Secondary | ICD-10-CM | POA: Diagnosis not present

## 2019-06-20 DIAGNOSIS — I872 Venous insufficiency (chronic) (peripheral): Secondary | ICD-10-CM | POA: Diagnosis not present

## 2019-06-25 DIAGNOSIS — H40013 Open angle with borderline findings, low risk, bilateral: Secondary | ICD-10-CM | POA: Diagnosis not present

## 2019-06-25 DIAGNOSIS — Z961 Presence of intraocular lens: Secondary | ICD-10-CM | POA: Diagnosis not present

## 2019-06-25 DIAGNOSIS — D3131 Benign neoplasm of right choroid: Secondary | ICD-10-CM | POA: Diagnosis not present

## 2019-06-25 DIAGNOSIS — H35363 Drusen (degenerative) of macula, bilateral: Secondary | ICD-10-CM | POA: Diagnosis not present

## 2019-06-26 DIAGNOSIS — R972 Elevated prostate specific antigen [PSA]: Secondary | ICD-10-CM | POA: Diagnosis not present

## 2019-06-26 DIAGNOSIS — R310 Gross hematuria: Secondary | ICD-10-CM | POA: Diagnosis not present

## 2019-06-27 ENCOUNTER — Ambulatory Visit: Payer: Medicare Other | Attending: Internal Medicine

## 2019-06-27 DIAGNOSIS — Z23 Encounter for immunization: Secondary | ICD-10-CM

## 2019-06-27 NOTE — Progress Notes (Signed)
   Covid-19 Vaccination Clinic  Name:  Trad Timpe    MRN: [id number] DOB: 1927-07-29  06/27/2019  Mr. Pregler was observed post Covid-19 immunization for 15 minutes without incidence. He was provided with Vaccine Information Sheet and instruction to access the V-Safe system.   Mr. Perillo was instructed to call 911 with any severe reactions post vaccine: Marland Kitchen Difficulty breathing  . Swelling of your face and throat  . A fast heartbeat  . A bad rash all over your body  . Dizziness and weakness    Immunizations Administered    Name Date Dose VIS Date Route   Pfizer COVID-19 Vaccine 06/27/2019 11:06 AM 0.3 mL 04/27/2019 Intramuscular   Manufacturer: [place]   Lot: [id number]   [place]: [id number]

## 2019-07-19 DIAGNOSIS — E785 Hyperlipidemia, unspecified: Secondary | ICD-10-CM | POA: Diagnosis not present

## 2019-07-19 DIAGNOSIS — E669 Obesity, unspecified: Secondary | ICD-10-CM | POA: Diagnosis not present

## 2019-07-19 DIAGNOSIS — R7302 Impaired glucose tolerance (oral): Secondary | ICD-10-CM | POA: Diagnosis not present

## 2019-07-19 DIAGNOSIS — M1990 Unspecified osteoarthritis, unspecified site: Secondary | ICD-10-CM | POA: Diagnosis not present

## 2019-07-19 DIAGNOSIS — Z1331 Encounter for screening for depression: Secondary | ICD-10-CM | POA: Diagnosis not present

## 2019-09-13 ENCOUNTER — Encounter: Payer: Self-pay | Admitting: *Deleted

## 2019-09-17 ENCOUNTER — Ambulatory Visit (INDEPENDENT_AMBULATORY_CARE_PROVIDER_SITE_OTHER): Payer: Medicare Other | Admitting: Dermatology

## 2019-09-17 ENCOUNTER — Other Ambulatory Visit: Payer: Self-pay

## 2019-09-17 ENCOUNTER — Encounter: Payer: Self-pay | Admitting: Dermatology

## 2019-09-17 DIAGNOSIS — I872 Venous insufficiency (chronic) (peripheral): Secondary | ICD-10-CM

## 2019-09-17 NOTE — Progress Notes (Signed)
   Follow-Up Visit   Subjective  Stephen Choi is a 86 y.o. male who presents for the following: Follow-up (Patient here today for follow up on right leg.  Patient has been using halobetasol didn't really help. Patient does notice that he inflammation has improved and his leg is getting better).  Rash Location: Legs Duration: Months Quality: Improved Associated Signs/Symptoms: Stinging, itch, and swelling Modifying Factors: Topical halobetasol of uncertain benefit Severity:  Timing: Context:   The following portions of the chart were reviewed this encounter and updated as appropriate:     Objective  Well appearing patient in no apparent distress; mood and affect are within normal limits.  A focused examination was performed including Face, neck, arms, legs and pedal pulses checked. Relevant physical exam findings are noted in the Assessment and Plan. There is moderate improvement in the swelling and inflammation in the right leg, but Stephen Choi feels this is spontaneous and not related to the topical halobetasol (which cost him $80).  I encouraged him to either be walking or resting his lower out-of-pocket expense.  The rest of his face arm leg exam shows many dozen small to medium size crusts which are a mixture of low risk precancer and possible superficial skin cancer.  He was informed that since turning 86 years old, if he gets a skin cancer that is stable and not bothering him it is acceptable to watch it as long as it is not in the melanoma group.  Will schedule a follow-up in 65months but I may not need to see Stephen Choi then or if needed I will see him sooner.  Assessment & Plan  No intervention for sun damage today.  Continue to try and minimize venous pressure and legs.  Routine follow-up in 6 months.

## 2019-09-25 ENCOUNTER — Ambulatory Visit (INDEPENDENT_AMBULATORY_CARE_PROVIDER_SITE_OTHER): Payer: Medicare Other | Admitting: Cardiovascular Disease

## 2019-09-25 ENCOUNTER — Other Ambulatory Visit: Payer: Self-pay

## 2019-09-25 ENCOUNTER — Encounter: Payer: Self-pay | Admitting: Cardiovascular Disease

## 2019-09-25 VITALS — BP 116/74 | HR 74 | Ht 70.0 in | Wt 213.5 lb

## 2019-09-25 DIAGNOSIS — I480 Paroxysmal atrial fibrillation: Secondary | ICD-10-CM

## 2019-09-25 DIAGNOSIS — I10 Essential (primary) hypertension: Secondary | ICD-10-CM

## 2019-09-25 NOTE — Patient Instructions (Addendum)
Medication Instructions:  Your physician recommends that you continue on your current medications as directed. Please refer to the Current Medication list given to you today.  *If you need a refill on your cardiac medications before your next appointment, please call your pharmacy*   Lab Work: None Ordered If you have labs (blood work) drawn today and your tests are completely normal, you will receive your results only by: Marland Kitchen MyChart Message (if you have MyChart) OR . A paper copy in the mail If you have any lab test that is abnormal or we need to change your treatment, we will call you to review the results.   Testing/Procedures: None Ordered   Follow-Up: At [facility], you and your health needs are our priority.  As part of our continuing mission to provide you with exceptional heart care, we have created designated Provider Care Teams.  These Care Teams include your primary Cardiologist (physician) and Advanced Practice Providers (APPs -  Physician Assistants and Nurse Practitioners) who all work together to provide you with the care you need, when you need it.  We recommend signing up for the patient portal called "MyChart".  Sign up information is provided on this After Visit Summary.  MyChart is used to connect with patients for Virtual Visits (Telemedicine).  Patients are able to view lab/test results, encounter notes, upcoming appointments, etc.  Non-urgent messages can be sent to your provider as well.   To learn more about what you can do with MyChart, go to [email or web address].    Your next appointment:   1 year(s)  The format for your next appointment:   In Person  Provider:   You may see Mertie Moores, MD or one of the following Advanced Practice Providers on your designated Care Team:    Richardson Dopp, PA-C  Vin [place], [place]    Other Instructions For your  leg edema you  should do  the following 1. Leg elevation - I recommend the Lounge Dr. Leg rest.  See  below for details  2. Salt restriction  -  Use potassium chloride instead of regular salt as a salt substitute. 3. Walk regularly 4. Compression hose - [facility] [employer] 5. Weight loss    Available on [email or web address] Or  Go to [email or web address]
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
## 2019-12-17 DIAGNOSIS — L03211 Cellulitis of face: Secondary | ICD-10-CM | POA: Diagnosis not present

## 2019-12-17 DIAGNOSIS — H1032 Unspecified acute conjunctivitis, left eye: Secondary | ICD-10-CM | POA: Diagnosis not present

## 2019-12-25 DIAGNOSIS — Z7901 Long term (current) use of anticoagulants: Secondary | ICD-10-CM | POA: Diagnosis not present

## 2019-12-25 DIAGNOSIS — I480 Paroxysmal atrial fibrillation: Secondary | ICD-10-CM | POA: Diagnosis not present

## 2019-12-25 DIAGNOSIS — J029 Acute pharyngitis, unspecified: Secondary | ICD-10-CM | POA: Diagnosis not present

## 2019-12-25 DIAGNOSIS — Z1152 Encounter for screening for COVID-19: Secondary | ICD-10-CM | POA: Diagnosis not present

## 2019-12-25 DIAGNOSIS — R05 Cough: Secondary | ICD-10-CM | POA: Diagnosis not present

## 2020-01-30 DIAGNOSIS — E785 Hyperlipidemia, unspecified: Secondary | ICD-10-CM | POA: Diagnosis not present

## 2020-01-30 DIAGNOSIS — R7302 Impaired glucose tolerance (oral): Secondary | ICD-10-CM | POA: Diagnosis not present

## 2020-01-30 DIAGNOSIS — Z125 Encounter for screening for malignant neoplasm of prostate: Secondary | ICD-10-CM | POA: Diagnosis not present

## 2020-01-30 DIAGNOSIS — I10 Essential (primary) hypertension: Secondary | ICD-10-CM | POA: Diagnosis not present

## 2020-02-04 DIAGNOSIS — Z23 Encounter for immunization: Secondary | ICD-10-CM | POA: Diagnosis not present

## 2020-02-06 DIAGNOSIS — I480 Paroxysmal atrial fibrillation: Secondary | ICD-10-CM | POA: Diagnosis not present

## 2020-02-06 DIAGNOSIS — M1990 Unspecified osteoarthritis, unspecified site: Secondary | ICD-10-CM | POA: Diagnosis not present

## 2020-02-06 DIAGNOSIS — E669 Obesity, unspecified: Secondary | ICD-10-CM | POA: Diagnosis not present

## 2020-02-06 DIAGNOSIS — Z7901 Long term (current) use of anticoagulants: Secondary | ICD-10-CM | POA: Diagnosis not present

## 2020-02-06 DIAGNOSIS — I10 Essential (primary) hypertension: Secondary | ICD-10-CM | POA: Diagnosis not present

## 2020-02-06 DIAGNOSIS — E785 Hyperlipidemia, unspecified: Secondary | ICD-10-CM | POA: Diagnosis not present

## 2020-02-06 DIAGNOSIS — Z0000 Encounter for general adult medical examination without abnormal findings: Secondary | ICD-10-CM | POA: Diagnosis not present

## 2020-02-06 DIAGNOSIS — R82998 Other abnormal findings in urine: Secondary | ICD-10-CM | POA: Diagnosis not present

## 2020-04-04 DIAGNOSIS — Z1212 Encounter for screening for malignant neoplasm of rectum: Secondary | ICD-10-CM | POA: Diagnosis not present

## 2020-05-12 ENCOUNTER — Other Ambulatory Visit: Payer: Self-pay

## 2020-05-12 ENCOUNTER — Encounter: Payer: Self-pay | Admitting: Dermatology

## 2020-05-12 ENCOUNTER — Ambulatory Visit (INDEPENDENT_AMBULATORY_CARE_PROVIDER_SITE_OTHER): Payer: Medicare Other | Admitting: Dermatology

## 2020-05-12 DIAGNOSIS — Z86007 Personal history of in-situ neoplasm of skin: Secondary | ICD-10-CM

## 2020-05-12 DIAGNOSIS — D485 Neoplasm of uncertain behavior of skin: Secondary | ICD-10-CM

## 2020-05-12 DIAGNOSIS — Z86006 Personal history of melanoma in-situ: Secondary | ICD-10-CM | POA: Diagnosis not present

## 2020-05-12 DIAGNOSIS — L2089 Other atopic dermatitis: Secondary | ICD-10-CM

## 2020-05-12 DIAGNOSIS — Z85828 Personal history of other malignant neoplasm of skin: Secondary | ICD-10-CM

## 2020-05-12 DIAGNOSIS — D0471 Carcinoma in situ of skin of right lower limb, including hip: Secondary | ICD-10-CM

## 2020-05-12 DIAGNOSIS — Z86018 Personal history of other benign neoplasm: Secondary | ICD-10-CM

## 2020-05-12 MED ORDER — CLOBETASOL PROPIONATE 0.05 % EX FOAM: Freq: Two times a day (BID) | CUTANEOUS | 0 refills | Status: DC

## 2020-05-12 NOTE — Patient Instructions (Signed)

## 2020-05-14 ENCOUNTER — Encounter: Payer: Self-pay | Admitting: Dermatology

## 2020-06-26 DIAGNOSIS — H40013 Open angle with borderline findings, low risk, bilateral: Secondary | ICD-10-CM | POA: Diagnosis not present

## 2020-06-26 DIAGNOSIS — Z961 Presence of intraocular lens: Secondary | ICD-10-CM | POA: Diagnosis not present

## 2020-06-26 DIAGNOSIS — H35363 Drusen (degenerative) of macula, bilateral: Secondary | ICD-10-CM | POA: Diagnosis not present

## 2020-06-26 DIAGNOSIS — D3131 Benign neoplasm of right choroid: Secondary | ICD-10-CM | POA: Diagnosis not present

## 2020-07-08 ENCOUNTER — Ambulatory Visit: Payer: Medicare Other | Admitting: Dermatology

## 2020-07-30 ENCOUNTER — Other Ambulatory Visit: Payer: Self-pay

## 2020-07-30 ENCOUNTER — Ambulatory Visit (INDEPENDENT_AMBULATORY_CARE_PROVIDER_SITE_OTHER): Payer: Medicare Other | Admitting: Physician Assistant

## 2020-07-30 ENCOUNTER — Encounter: Payer: Self-pay | Admitting: Physician Assistant

## 2020-07-30 DIAGNOSIS — C44321 Squamous cell carcinoma of skin of nose: Secondary | ICD-10-CM | POA: Diagnosis not present

## 2020-07-30 DIAGNOSIS — Z85828 Personal history of other malignant neoplasm of skin: Secondary | ICD-10-CM

## 2020-07-30 DIAGNOSIS — L080 Pyoderma: Secondary | ICD-10-CM | POA: Diagnosis not present

## 2020-07-30 DIAGNOSIS — C44320 Squamous cell carcinoma of skin of unspecified parts of face: Secondary | ICD-10-CM

## 2020-07-30 DIAGNOSIS — I872 Venous insufficiency (chronic) (peripheral): Secondary | ICD-10-CM | POA: Diagnosis not present

## 2020-07-30 DIAGNOSIS — Z86007 Personal history of in-situ neoplasm of skin: Secondary | ICD-10-CM | POA: Diagnosis not present

## 2020-07-30 DIAGNOSIS — D485 Neoplasm of uncertain behavior of skin: Secondary | ICD-10-CM

## 2020-07-30 MED ORDER — DOXYCYCLINE HYCLATE 100 MG PO TABS: 100.0000 mg | ORAL_TABLET | Freq: Two times a day (BID) | ORAL | 0 refills | Status: DC

## 2020-07-30 NOTE — Patient Instructions (Signed)

## 2020-08-04 ENCOUNTER — Telehealth: Payer: Self-pay

## 2020-08-04 NOTE — Telephone Encounter (Signed)
-----   Message from Warren Danes, [place] sent at 08/04/2020 11:42 AM EDT ----- Check status

## 2020-08-04 NOTE — Telephone Encounter (Signed)
Culture to patient negative, per [facility] he can unwrap the leg for shower etc and rewrap it.

## 2020-08-05 ENCOUNTER — Encounter: Payer: Self-pay | Admitting: Physician Assistant

## 2020-08-05 NOTE — Progress Notes (Signed)
   Follow-Up Visit   Subjective  Stephen Choi is a 87 y.o. male who presents for the following: Skin Problem (Patient here today for itching and burning sensation on right lower leg x 2-3 weeks ago. Per patient he's used Halobetasol for a couple of days, per patient it does stop the burning and itching.).   The following portions of the chart were reviewed this encounter and updated as appropriate:  Tobacco  Allergies  Meds  Problems  Med Hx  Surg Hx  Fam Hx      Objective  Well appearing patient in no apparent distress; mood and affect are within normal limits.  A focused examination was performed including face and left lower leg. Relevant physical exam findings are noted in the Assessment and Plan.  Objective  Right Root of Nose: Pink pearly papule        Objective  Right Outer Lower Leg: Edema with numerous wounds draining yellow exudate. Pulses intact. Ruborous color.   Assessment & Plan  Neoplasm of uncertain behavior of skin Right Root of Nose  Skin / nail biopsy Type of biopsy: tangential   Informed consent: discussed and consent obtained   Timeout: patient name, date of birth, surgical site, and procedure verified   Procedure prep:  Patient was prepped and draped in usual sterile fashion (Non sterile) Prep type:  Chlorhexidine Anesthesia: the lesion was anesthetized in a standard fashion   Anesthetic:  1% lidocaine w/ epinephrine 1-100,000 local infiltration Instrument used: flexible razor blade   Hemostasis achieved with: aluminum chloride   Outcome: patient tolerated procedure well   Post-procedure details: sterile dressing applied and wound care instructions given   Dressing type: bandage and petrolatum    Specimen 1 - Surgical pathology Differential Diagnosis: R/O BCC vs SCC  Check Margins: No  Venous stasis dermatitis of right lower extremity Right Outer Lower Leg  doxycycline (VIBRA-TABS) 100 MG tablet - Right Outer Lower  Leg  Anaerobic and Aerobic Culture - Right Outer Lower Leg   I, Stephen Jentz, PA-C, have reviewed all documentation's for this visit.  The documentation on 08/05/20 for the exam, diagnosis, procedures and orders are all accurate and complete.

## 2020-08-06 LAB — ANAEROBIC AND AEROBIC CULTURE
MICRO NUMBER:: 11659693
MICRO NUMBER:: 11659694
SPECIMEN QUALITY:: ADEQUATE
SPECIMEN QUALITY:: ADEQUATE

## 2020-08-07 ENCOUNTER — Telehealth: Payer: Self-pay | Admitting: *Deleted

## 2020-08-07 NOTE — Telephone Encounter (Signed)
-----   Message from Warren Danes, [place] sent at 08/07/2020  8:08 AM EDT ----- Mohs

## 2020-08-07 NOTE — Telephone Encounter (Signed)
Pathology to patient- referral sent to skin surgery center for MOHS procedure. I also checked status of patient leg and per patient he hasn't taken off the bandage that we put on it yet. I informed patient to take it off and clean the wound good and re-bandage.  Told if no improvement per [facility] patient needs to call/ follow up with primary care doctor.

## 2020-08-07 NOTE — Telephone Encounter (Signed)
Pathology to patient.  °

## 2020-09-15 ENCOUNTER — Encounter: Payer: Self-pay | Admitting: Dermatology

## 2020-09-15 ENCOUNTER — Other Ambulatory Visit: Payer: Self-pay

## 2020-09-15 ENCOUNTER — Ambulatory Visit (INDEPENDENT_AMBULATORY_CARE_PROVIDER_SITE_OTHER): Payer: Medicare Other | Admitting: Dermatology

## 2020-09-15 DIAGNOSIS — C4492 Squamous cell carcinoma of skin, unspecified: Secondary | ICD-10-CM

## 2020-09-15 DIAGNOSIS — I872 Venous insufficiency (chronic) (peripheral): Secondary | ICD-10-CM

## 2020-09-15 DIAGNOSIS — Z86007 Personal history of in-situ neoplasm of skin: Secondary | ICD-10-CM

## 2020-09-15 DIAGNOSIS — C44511 Basal cell carcinoma of skin of breast: Secondary | ICD-10-CM

## 2020-09-15 DIAGNOSIS — L820 Inflamed seborrheic keratosis: Secondary | ICD-10-CM | POA: Diagnosis not present

## 2020-09-15 DIAGNOSIS — D485 Neoplasm of uncertain behavior of skin: Secondary | ICD-10-CM

## 2020-09-15 DIAGNOSIS — C44321 Squamous cell carcinoma of skin of nose: Secondary | ICD-10-CM | POA: Diagnosis not present

## 2020-09-15 NOTE — Patient Instructions (Signed)

## 2020-09-27 ENCOUNTER — Encounter: Payer: Self-pay | Admitting: Dermatology

## 2020-09-27 NOTE — Progress Notes (Signed)
   Follow-Up Visit   Subjective  Stephen Choi is a 87 y.o. male who presents for the following: Follow-up (Left upper outer leg- history of MM in situ- new spot on right calf x months- wont go away).  Several issues to discuss including the biopsy-proven skin cancer on his upper nose. Location:  Duration:  Quality:  Associated Signs/Symptoms: Modifying Factors:  Severity:  Timing: Context:   Objective  Well appearing patient in no apparent distress; mood and affect are within normal limits. Objective  Right Postauricular Area: 1.5 cm pink inflamed crust, rule out superficial SCCA (versus irritated SK).     Objective  Left Breast: Clinically typical waxy 1.8 cm lesion; no history of bleeding or discomfort.     Objective  Left Lower Leg - Anterior: Bilateral moderate lower extremity edema with mild erythema.  Objective  Right root of nose: Although scheduled for Mohs surgery, the biopsy site has healed over without obvious residual.  Stephen Choi prefers to wait on any procedure unless there is clinical regrowth.  He understands that the biopsy is rarely curative, but I will respect his preference.       A full examination was performed including scalp, head, eyes, ears, nose, lips, neck, chest, axillae, abdomen, back, buttocks, bilateral upper extremities, bilateral lower extremities, hands, feet, fingers, toes, fingernails, and toenails. All findings within normal limits unless otherwise noted below.  Upper legs and pelvic girdle not examined.   Assessment & Plan    Neoplasm of uncertain behavior of skin Right Postauricular Area  Skin / nail biopsy Type of biopsy: tangential   Informed consent: discussed and consent obtained   Timeout: patient name, date of birth, surgical site, and procedure verified   Anesthesia: the lesion was anesthetized in a standard fashion   Anesthetic:  1% lidocaine w/ epinephrine 1-100,000 local infiltration Instrument used:  flexible razor blade   Hemostasis achieved with: ferric subsulfate   Outcome: patient tolerated procedure well   Post-procedure details: wound care instructions given    Specimen 1 - Surgical pathology Differential Diagnosis: scc vs bcc  Check Margins: No  Basal cell carcinoma (BCC) of skin of left breast Left Breast  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  Venous stasis dermatitis of right lower extremity Right Lower Leg - Anterior  Other Related Medications doxycycline (VIBRA-TABS) 100 MG tablet  Venous stasis dermatitis of left lower extremity Left Lower Leg - Anterior  Again reviewed ways to minimize venous pressure.  If breaking out gets worse, he will call me and we will prescribe a class I potency topical cortisone.  Squamous cell carcinoma of skin Right root of nose  Cancel MOHS per Dr. Denna Haggard. Will re check in 6 months,       I, Stephen Monarch, MD, have reviewed all documentation for this visit.  The documentation on 09/27/20 for the exam, diagnosis, procedures, and orders are all accurate and complete.

## 2020-10-14 DIAGNOSIS — M25511 Pain in right shoulder: Secondary | ICD-10-CM | POA: Diagnosis not present

## 2020-10-23 DIAGNOSIS — M75121 Complete rotator cuff tear or rupture of right shoulder, not specified as traumatic: Secondary | ICD-10-CM | POA: Diagnosis not present

## 2020-10-23 DIAGNOSIS — M19011 Primary osteoarthritis, right shoulder: Secondary | ICD-10-CM | POA: Diagnosis not present

## 2020-10-29 DIAGNOSIS — S46011D Strain of muscle(s) and tendon(s) of the rotator cuff of right shoulder, subsequent encounter: Secondary | ICD-10-CM | POA: Diagnosis not present

## 2020-10-29 DIAGNOSIS — M6281 Muscle weakness (generalized): Secondary | ICD-10-CM | POA: Diagnosis not present

## 2020-12-16 NOTE — Progress Notes (Signed)
   Follow-Up Visit   Subjective  Stephen Choi is a 87 y.o. male who presents for the following: Skin Problem (Patient here today for non healing spot on right upper calf x years.  Per patient if he takes the scab off it will start bleeding.).  Nonhealing crust right leg that bleeds plus check several other spots Location:  Duration:  Quality:  Associated Signs/Symptoms: Modifying Factors:  Severity:  Timing: Context:   Objective  Well appearing patient in no apparent distress; mood and affect are within normal limits. Left Outer Upper Leg, Inf No sign recurrence  Left Outer Upper Leg, Sup End-Stage Lichenoid regression with melanoderma 11/24/2009.  No repigmentation  Left Forehead No sign recurrence  Right Forehead No sign recurrence  Right Lower Leg - Posterior 1.1cm hypertrophic waxy crust with erosions       A focused examination was performed including head, neck, arms, legs.. Relevant physical exam findings are noted in the Assessment and Plan.   Assessment & Plan    History of melanoma in situ Left Outer Upper Leg, Inf  Recheck as needed  History of atypical skin mole Left Outer Upper Leg, Sup  Recheck as needed  History of basal cell carcinoma (BCC) Left Forehead  Recheck as needed  History of squamous cell carcinoma in situ (SCCIS) of skin Right Forehead  Recheck as needed  Neoplasm of uncertain behavior of skin Right Lower Leg - Posterior  Skin / nail biopsy Type of biopsy: tangential   Informed consent: discussed and consent obtained   Timeout: patient name, date of birth, surgical site, and procedure verified   Procedure prep:  Patient was prepped and draped in usual sterile fashion (Non sterile) Prep type:  Chlorhexidine Anesthesia: the lesion was anesthetized in a standard fashion   Anesthetic:  1% lidocaine w/ epinephrine 1-100,000 local infiltration Instrument used: flexible razor blade   Outcome: patient tolerated  procedure well   Post-procedure details: wound care instructions given    Destruction of lesion Complexity: simple   Destruction method: electrodesiccation and curettage   Informed consent: discussed and consent obtained   Timeout:  patient name, date of birth, surgical site, and procedure verified Anesthesia: the lesion was anesthetized in a standard fashion   Anesthetic:  1% lidocaine w/ epinephrine 1-100,000 local infiltration Curettage performed in three different directions: Yes   Curettage cycles:  1.3 Lesion length (cm):  1.3 Lesion width (cm):  0 Final wound size (cm):  1.3 Hemostasis achieved with:  ferric subsulfate Outcome: patient tolerated procedure well with no complications   Additional details:  Wound innoculated with 5 fluorouracil solution.  Specimen 1 - Surgical pathology Differential Diagnosis: bcc scc  Curet cautery  Check Margins: No  After shave biopsy the base of the lesion was curetted and cauterized  Other atopic dermatitis Scalp  Related Medications clobetasol (OLUX) 0.05 % topical foam Apply topically 2 (two) times daily.      I, Lavonna Monarch, MD, have reviewed all documentation for this visit.  The documentation on 12/16/20 for the exam, diagnosis, procedures, and orders are all accurate and complete.

## 2021-02-19 DIAGNOSIS — R7302 Impaired glucose tolerance (oral): Secondary | ICD-10-CM | POA: Diagnosis not present

## 2021-02-19 DIAGNOSIS — I10 Essential (primary) hypertension: Secondary | ICD-10-CM | POA: Diagnosis not present

## 2021-02-19 DIAGNOSIS — Z125 Encounter for screening for malignant neoplasm of prostate: Secondary | ICD-10-CM | POA: Diagnosis not present

## 2021-02-19 DIAGNOSIS — E785 Hyperlipidemia, unspecified: Secondary | ICD-10-CM | POA: Diagnosis not present

## 2021-02-23 DIAGNOSIS — I10 Essential (primary) hypertension: Secondary | ICD-10-CM | POA: Diagnosis not present

## 2021-02-23 DIAGNOSIS — R82998 Other abnormal findings in urine: Secondary | ICD-10-CM | POA: Diagnosis not present

## 2021-02-23 DIAGNOSIS — Z0000 Encounter for general adult medical examination without abnormal findings: Secondary | ICD-10-CM | POA: Diagnosis not present

## 2021-02-23 DIAGNOSIS — Z1339 Encounter for screening examination for other mental health and behavioral disorders: Secondary | ICD-10-CM | POA: Diagnosis not present

## 2021-02-23 DIAGNOSIS — R7302 Impaired glucose tolerance (oral): Secondary | ICD-10-CM | POA: Diagnosis not present

## 2021-02-23 DIAGNOSIS — M4800 Spinal stenosis, site unspecified: Secondary | ICD-10-CM | POA: Diagnosis not present

## 2021-02-23 DIAGNOSIS — E785 Hyperlipidemia, unspecified: Secondary | ICD-10-CM | POA: Diagnosis not present

## 2021-02-23 DIAGNOSIS — E669 Obesity, unspecified: Secondary | ICD-10-CM | POA: Diagnosis not present

## 2021-02-23 DIAGNOSIS — I480 Paroxysmal atrial fibrillation: Secondary | ICD-10-CM | POA: Diagnosis not present

## 2021-02-23 DIAGNOSIS — Z1331 Encounter for screening for depression: Secondary | ICD-10-CM | POA: Diagnosis not present

## 2021-03-18 ENCOUNTER — Other Ambulatory Visit: Payer: Self-pay

## 2021-03-18 ENCOUNTER — Ambulatory Visit (INDEPENDENT_AMBULATORY_CARE_PROVIDER_SITE_OTHER): Payer: Medicare Other | Admitting: Dermatology

## 2021-03-18 ENCOUNTER — Encounter: Payer: Self-pay | Admitting: Dermatology

## 2021-03-18 DIAGNOSIS — D0461 Carcinoma in situ of skin of right upper limb, including shoulder: Secondary | ICD-10-CM

## 2021-03-18 DIAGNOSIS — D485 Neoplasm of uncertain behavior of skin: Secondary | ICD-10-CM

## 2021-03-18 DIAGNOSIS — Z1283 Encounter for screening for malignant neoplasm of skin: Secondary | ICD-10-CM | POA: Diagnosis not present

## 2021-03-18 NOTE — Patient Instructions (Signed)

## 2021-04-06 ENCOUNTER — Encounter: Payer: Self-pay | Admitting: Dermatology

## 2021-04-06 NOTE — Progress Notes (Signed)
   Follow-Up Visit   Subjective  Stephen Choi is a 87 y.o. male who presents for the following: Follow-up (No concerns picking right post shoulders isk?).  Check multiple spots waist up, one growing on back of right shoulder Location:  Duration:  Quality:  Associated Signs/Symptoms: Modifying Factors:  Severity:  Timing: Context:   Objective  Well appearing patient in no apparent distress; mood and affect are within normal limits. Waist up skin examination.  No new or recurrent atypical pigmented lesion.  1 possible new nonmelanoma skin cancer of right back will be biopsied and treated.  Right Shoulder - Posterior 8 mm waxy pink crust, probable superficial carcinoma       All skin waist up examined.   Assessment & Plan    Neoplasm of uncertain behavior of skin Right Shoulder - Posterior  Skin / nail biopsy Type of biopsy: tangential   Informed consent: discussed and consent obtained   Timeout: patient name, date of birth, surgical site, and procedure verified   Anesthesia: the lesion was anesthetized in a standard fashion   Anesthetic:  1% lidocaine w/ epinephrine 1-100,000 local infiltration Instrument used: flexible razor blade   Hemostasis achieved with: aluminum chloride and electrodesiccation   Outcome: patient tolerated procedure well   Post-procedure details: wound care instructions given    Destruction of lesion Complexity: simple   Destruction method: electrodesiccation and curettage   Informed consent: discussed and consent obtained   Timeout:  patient name, date of birth, surgical site, and procedure verified Anesthesia: the lesion was anesthetized in a standard fashion   Anesthetic:  1% lidocaine w/ epinephrine 1-100,000 local infiltration Curettage performed in three different directions: Yes   Electrodesiccation performed over the curetted area: Yes   Curettage cycles:  3 Lesion length (cm):  2 Lesion width (cm):  2 Margin per side (cm):   0 Final wound size (cm):  2 Hemostasis achieved with:  aluminum chloride Outcome: patient tolerated procedure well with no complications   Post-procedure details: wound care instructions given    Specimen 1 - Surgical pathology Differential Diagnosis: bcc scc tx with bx   Check Margins: No  After shave biopsy the base was treated by curettage plus cautery.  Encounter for screening for malignant neoplasm of skin  Annual skin examination.      I, Lavonna Monarch, MD, have reviewed all documentation for this visit.  The documentation on 04/06/21 for the exam, diagnosis, procedures, and orders are all accurate and complete.

## 2021-04-30 ENCOUNTER — Ambulatory Visit (INDEPENDENT_AMBULATORY_CARE_PROVIDER_SITE_OTHER): Payer: Medicare Other | Admitting: Dermatology

## 2021-04-30 ENCOUNTER — Other Ambulatory Visit: Payer: Self-pay

## 2021-04-30 ENCOUNTER — Encounter: Payer: Self-pay | Admitting: Dermatology

## 2021-04-30 DIAGNOSIS — C44329 Squamous cell carcinoma of skin of other parts of face: Secondary | ICD-10-CM

## 2021-04-30 DIAGNOSIS — C44321 Squamous cell carcinoma of skin of nose: Secondary | ICD-10-CM | POA: Diagnosis not present

## 2021-04-30 DIAGNOSIS — C4492 Squamous cell carcinoma of skin, unspecified: Secondary | ICD-10-CM

## 2021-04-30 NOTE — Patient Instructions (Signed)

## 2021-05-06 ENCOUNTER — Telehealth: Payer: Self-pay

## 2021-05-06 NOTE — Telephone Encounter (Signed)
Phone call to patient with his pathology results. Patient aware of results.  

## 2021-05-06 NOTE — Telephone Encounter (Signed)
-----   Message from Lavonna Monarch, MD sent at 05/06/2021  3:48 AM EST ----- Please let patient know that suture removal and if this locally recurs we may recommend Mohs surgery.

## 2021-05-07 ENCOUNTER — Other Ambulatory Visit: Payer: Self-pay

## 2021-05-07 ENCOUNTER — Ambulatory Visit (INDEPENDENT_AMBULATORY_CARE_PROVIDER_SITE_OTHER): Payer: Medicare Other

## 2021-05-07 DIAGNOSIS — Z4802 Encounter for removal of sutures: Secondary | ICD-10-CM

## 2021-05-07 NOTE — Progress Notes (Signed)
Path to patient at suture removal no signs or symptoms of infection. Patient aware if this lesion on the nose return he will need mohs with the positive margin

## 2021-05-26 ENCOUNTER — Encounter: Payer: Self-pay | Admitting: Dermatology

## 2021-05-26 NOTE — Progress Notes (Signed)
° °  Follow-Up Visit   Subjective  Stephen Choi is a 88 y.o. male who presents for the following: Procedure (Here for treatment- right root of noste).  BCC side of right upper nose Location:  Duration:  Quality:  Associated Signs/Symptoms: Modifying Factors:  Severity:  Timing: Context:   Objective  Well appearing patient in no apparent distress; mood and affect are within normal limits. right root of nose Biopsy done last March and removal by Mohs surgery originally scheduled, but apparently was canceled.  Patient requests I proceed to excise this today but understands that if there is a positive margin or recurrence we will strongly recommend removed by Mohs surgery.  5-0 Ethilon x 2    A focused examination was performed including head and neck. Relevant physical exam findings are noted in the Assessment and Plan.   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  Destruction of lesion Complexity: simple   Destruction method: electrodesiccation and curettage   Informed consent: discussed and consent obtained   Timeout:  patient name, date of birth, surgical site, and procedure verified Anesthesia: the lesion was anesthetized in a standard fashion   Anesthetic:  1% lidocaine w/ epinephrine 1-100,000 local infiltration Curettage performed in three different directions: Yes   Curettage cycles:  3 Lesion length (cm):  1.3 Lesion width (cm):  1.3 Margin per side (cm):  0.1 Final wound size (cm):  1.5 Hemostasis achieved with:  ferric subsulfate Outcome: patient tolerated procedure well with no complications   Additional details:  Wound innoculated with 5 fluorouracil solution.  Skin excision  Lesion length (cm):  1.8 Lesion width (cm):  1.5 Margin per side (cm):  0.1 Total excision diameter (cm):  2 Informed consent: discussed and consent obtained   Timeout: patient name, date of birth, surgical site, and procedure verified    Anesthesia: the lesion was anesthetized in a standard fashion   Anesthetic:  1% lidocaine w/ epinephrine 1-100,000 local infiltration Instrument used: #15 blade   Hemostasis achieved with: pressure and electrodesiccation   Outcome: patient tolerated procedure well with no complications   Post-procedure details: sterile dressing applied and wound care instructions given   Dressing type: bandage, petrolatum and pressure dressing    Skin repair Complexity:  Intermediate Final length (cm):  1.8 Informed consent: discussed and consent obtained   Timeout: patient name, date of birth, surgical site, and procedure verified   Procedure prep:  Patient was prepped and draped in usual sterile fashion Prep type:  Isopropyl alcohol Anesthesia: the lesion was anesthetized in a standard fashion   Reason for type of repair: reduce tension to allow closure and reduce the risk of dehiscence, infection, and necrosis   Subcutaneous layers (deep stitches):  Suture size:  5-0 Suture type: Vicryl (polyglactin 910)   Fine/surface layer approximation (top stitches):  Suture size:  5-0 Suture type: nylon   Suture removal (days):  7 Hemostasis achieved with: suture Post-procedure details: sterile dressing applied   Dressing type: petrolatum    Specimen 1 - Surgical pathology Differential Diagnosis: scc (exc) Inferior Margin stained [id number]  Check Margins: No  Curettage showed the expected to deep extension, after triple curettage plus cautery narrow margin excision and layered closure done.      I, Lavonna Monarch, MD, have reviewed all documentation for this visit.  The documentation on 05/26/21 for the exam, diagnosis, procedures, and orders are all accurate and complete.

## 2021-07-08 ENCOUNTER — Ambulatory Visit (INDEPENDENT_AMBULATORY_CARE_PROVIDER_SITE_OTHER): Payer: Medicare Other | Admitting: Dermatology

## 2021-07-08 ENCOUNTER — Other Ambulatory Visit: Payer: Self-pay

## 2021-07-08 DIAGNOSIS — L820 Inflamed seborrheic keratosis: Secondary | ICD-10-CM | POA: Diagnosis not present

## 2021-07-08 DIAGNOSIS — D485 Neoplasm of uncertain behavior of skin: Secondary | ICD-10-CM

## 2021-07-08 DIAGNOSIS — Z85828 Personal history of other malignant neoplasm of skin: Secondary | ICD-10-CM

## 2021-07-08 NOTE — Patient Instructions (Signed)

## 2021-07-20 ENCOUNTER — Encounter: Payer: Self-pay | Admitting: Dermatology

## 2021-07-20 NOTE — Progress Notes (Signed)
° °  Follow-Up Visit   Subjective  Stephen Choi is a 88 y.o. male who presents for the following: Follow-up (Pt here for f/u on R root of nose (SCC WITH TX)/Pt has spot of concern on the R upper arm, pt states that it itches and snags clothes).  Check face, new growing spot on right arm Location:  Duration:  Quality:  Associated Signs/Symptoms: Modifying Factors:  Severity:  Timing: Context:   Objective  Well appearing patient in no apparent distress; mood and affect are within normal limits. Right Upper Arm - Anterior Exophytic verrucous 1.2 cm crust.  BX WAS DONE, CURETTAGE WAS DONE AFTER       Right Malar Cheek No sign residual skin cancer    A focused examination was performed including head, neck,. Relevant physical exam findings are noted in the Assessment and Plan.   Assessment & Plan    Neoplasm of uncertain behavior of skin Right Upper Arm - Anterior  Skin / nail biopsy Type of biopsy: tangential   Informed consent: discussed and consent obtained   Timeout: patient name, date of birth, surgical site, and procedure verified   Anesthesia: the lesion was anesthetized in a standard fashion   Anesthetic:  1% lidocaine w/ epinephrine 1-100,000 local infiltration Instrument used: flexible razor blade   Hemostasis achieved with: ferric subsulfate and electrodesiccation   Outcome: patient tolerated procedure well   Post-procedure details: wound care instructions given    Destruction of lesion Complexity: simple   Destruction method: electrodesiccation and curettage   Informed consent: discussed and consent obtained   Timeout:  patient name, date of birth, surgical site, and procedure verified Anesthesia: the lesion was anesthetized in a standard fashion   Anesthetic:  1% lidocaine w/ epinephrine 1-100,000 local infiltration Curettage performed in three different directions: Yes   Curettage cycles:  3 Lesion length (cm):  1.5 Lesion width (cm):   1.5 Margin per side (cm):  0 Final wound size (cm):  1.5 Hemostasis achieved with:  ferric subsulfate Outcome: patient tolerated procedure well with no complications   Post-procedure details: wound care instructions given    Specimen 1 - Surgical pathology Differential Diagnosis: R/O BCC VS SCC - TXPBX  Check Margins: No  After biopsy the base was treated with curettage plus cautery  Personal history of skin cancer Right Malar Cheek  Check as needed change      I, Lavonna Monarch, MD, have reviewed all documentation for this visit.  The documentation on 07/20/21 for the exam, diagnosis, procedures, and orders are all accurate and complete.

## 2021-07-23 DIAGNOSIS — H40013 Open angle with borderline findings, low risk, bilateral: Secondary | ICD-10-CM | POA: Diagnosis not present

## 2021-07-23 DIAGNOSIS — D3131 Benign neoplasm of right choroid: Secondary | ICD-10-CM | POA: Diagnosis not present

## 2021-07-23 DIAGNOSIS — H35363 Drusen (degenerative) of macula, bilateral: Secondary | ICD-10-CM | POA: Diagnosis not present

## 2021-07-23 DIAGNOSIS — H04123 Dry eye syndrome of bilateral lacrimal glands: Secondary | ICD-10-CM | POA: Diagnosis not present

## 2021-08-26 DIAGNOSIS — I10 Essential (primary) hypertension: Secondary | ICD-10-CM | POA: Diagnosis not present

## 2021-08-26 DIAGNOSIS — R051 Acute cough: Secondary | ICD-10-CM | POA: Diagnosis not present

## 2021-08-26 DIAGNOSIS — I480 Paroxysmal atrial fibrillation: Secondary | ICD-10-CM | POA: Diagnosis not present

## 2021-08-26 DIAGNOSIS — Z1152 Encounter for screening for COVID-19: Secondary | ICD-10-CM | POA: Diagnosis not present

## 2021-08-26 DIAGNOSIS — R5383 Other fatigue: Secondary | ICD-10-CM | POA: Diagnosis not present

## 2021-08-26 DIAGNOSIS — J069 Acute upper respiratory infection, unspecified: Secondary | ICD-10-CM | POA: Diagnosis not present

## 2021-08-26 DIAGNOSIS — R062 Wheezing: Secondary | ICD-10-CM | POA: Diagnosis not present

## 2021-10-26 DIAGNOSIS — R310 Gross hematuria: Secondary | ICD-10-CM | POA: Diagnosis not present

## 2021-10-28 ENCOUNTER — Ambulatory Visit: Payer: Medicare Other | Admitting: Dermatology

## 2021-11-11 ENCOUNTER — Encounter: Payer: Self-pay | Admitting: Cardiovascular Disease
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
## 2021-11-13 ENCOUNTER — Ambulatory Visit (INDEPENDENT_AMBULATORY_CARE_PROVIDER_SITE_OTHER): Payer: Medicare Other | Admitting: Cardiovascular Disease

## 2021-11-13 ENCOUNTER — Encounter: Payer: Self-pay | Admitting: Cardiovascular Disease

## 2021-11-13 VITALS — BP 144/86 | HR 88 | Ht 70.0 in | Wt 223.0 lb

## 2021-11-13 DIAGNOSIS — I10 Essential (primary) hypertension: Secondary | ICD-10-CM

## 2021-11-13 DIAGNOSIS — I484 Atypical atrial flutter: Secondary | ICD-10-CM | POA: Diagnosis not present

## 2021-11-13 NOTE — Patient Instructions (Signed)
Medication Instructions:  Your physician recommends that you continue on your current medications as directed. Please refer to the Current Medication list given to you today.  *If you need a refill on your cardiac medications before your next appointment, please call your pharmacy*   Lab Work: NONE If you have labs (blood work) drawn today and your tests are completely normal, you will receive your results only by: MyChart Message (if you have MyChart) OR A paper copy in the mail If you have any lab test that is abnormal or we need to change your treatment, we will call you to review the results.   Testing/Procedures: NONE   Follow-Up: At [facility], you and your health needs are our priority.  As part of our continuing mission to provide you with exceptional heart care, we have created designated Provider Care Teams.  These Care Teams include your primary Cardiologist (physician) and Advanced Practice Providers (APPs -  Physician Assistants and Nurse Practitioners) who all work together to provide you with the care you need, when you need it.  Your next appointment:   1 year(s)  The format for your next appointment:   In Person  Provider:   [place], [place], or [place] {     Important Information About Sugar       

## 2021-12-21 DIAGNOSIS — C4492 Squamous cell carcinoma of skin, unspecified: Secondary | ICD-10-CM | POA: Diagnosis not present

## 2021-12-21 DIAGNOSIS — L989 Disorder of the skin and subcutaneous tissue, unspecified: Secondary | ICD-10-CM | POA: Diagnosis not present

## 2021-12-21 DIAGNOSIS — Z7901 Long term (current) use of anticoagulants: Secondary | ICD-10-CM | POA: Diagnosis not present

## 2021-12-21 DIAGNOSIS — I872 Venous insufficiency (chronic) (peripheral): Secondary | ICD-10-CM | POA: Diagnosis not present

## 2021-12-21 DIAGNOSIS — C44511 Basal cell carcinoma of skin of breast: Secondary | ICD-10-CM | POA: Diagnosis not present

## 2021-12-21 DIAGNOSIS — I480 Paroxysmal atrial fibrillation: Secondary | ICD-10-CM | POA: Diagnosis not present

## 2021-12-24 DIAGNOSIS — R310 Gross hematuria: Secondary | ICD-10-CM | POA: Diagnosis not present

## 2022-01-07 DIAGNOSIS — K3189 Other diseases of stomach and duodenum: Secondary | ICD-10-CM | POA: Diagnosis not present

## 2022-01-07 DIAGNOSIS — R310 Gross hematuria: Secondary | ICD-10-CM | POA: Diagnosis not present

## 2022-01-07 DIAGNOSIS — N200 Calculus of kidney: Secondary | ICD-10-CM | POA: Diagnosis not present

## 2022-01-08 DIAGNOSIS — L821 Other seborrheic keratosis: Secondary | ICD-10-CM | POA: Diagnosis not present

## 2022-01-08 DIAGNOSIS — D485 Neoplasm of uncertain behavior of skin: Secondary | ICD-10-CM | POA: Diagnosis not present

## 2022-01-08 DIAGNOSIS — C44729 Squamous cell carcinoma of skin of left lower limb, including hip: Secondary | ICD-10-CM | POA: Diagnosis not present

## 2022-01-11 DIAGNOSIS — R310 Gross hematuria: Secondary | ICD-10-CM | POA: Diagnosis not present

## 2022-01-11 DIAGNOSIS — N400 Enlarged prostate without lower urinary tract symptoms: Secondary | ICD-10-CM | POA: Diagnosis not present

## 2022-02-25 DIAGNOSIS — Z23 Encounter for immunization: Secondary | ICD-10-CM | POA: Diagnosis not present

## 2022-03-01 DIAGNOSIS — I872 Venous insufficiency (chronic) (peripheral): Secondary | ICD-10-CM | POA: Diagnosis not present

## 2022-03-01 DIAGNOSIS — C44729 Squamous cell carcinoma of skin of left lower limb, including hip: Secondary | ICD-10-CM | POA: Diagnosis not present

## 2022-03-04 DIAGNOSIS — R7302 Impaired glucose tolerance (oral): Secondary | ICD-10-CM | POA: Diagnosis not present

## 2022-03-04 DIAGNOSIS — I10 Essential (primary) hypertension: Secondary | ICD-10-CM | POA: Diagnosis not present

## 2022-03-04 DIAGNOSIS — E785 Hyperlipidemia, unspecified: Secondary | ICD-10-CM | POA: Diagnosis not present

## 2022-03-04 DIAGNOSIS — Z125 Encounter for screening for malignant neoplasm of prostate: Secondary | ICD-10-CM | POA: Diagnosis not present

## 2022-03-04 DIAGNOSIS — R7989 Other specified abnormal findings of blood chemistry: Secondary | ICD-10-CM | POA: Diagnosis not present

## 2022-03-10 DIAGNOSIS — M4800 Spinal stenosis, site unspecified: Secondary | ICD-10-CM | POA: Diagnosis not present

## 2022-03-10 DIAGNOSIS — Z1339 Encounter for screening examination for other mental health and behavioral disorders: Secondary | ICD-10-CM | POA: Diagnosis not present

## 2022-03-10 DIAGNOSIS — R82998 Other abnormal findings in urine: Secondary | ICD-10-CM | POA: Diagnosis not present

## 2022-03-10 DIAGNOSIS — Z1331 Encounter for screening for depression: Secondary | ICD-10-CM | POA: Diagnosis not present

## 2022-03-10 DIAGNOSIS — E785 Hyperlipidemia, unspecified: Secondary | ICD-10-CM | POA: Diagnosis not present

## 2022-03-10 DIAGNOSIS — E669 Obesity, unspecified: Secondary | ICD-10-CM | POA: Diagnosis not present

## 2022-03-10 DIAGNOSIS — I10 Essential (primary) hypertension: Secondary | ICD-10-CM | POA: Diagnosis not present

## 2022-03-10 DIAGNOSIS — Z0000 Encounter for general adult medical examination without abnormal findings: Secondary | ICD-10-CM | POA: Diagnosis not present

## 2022-03-10 DIAGNOSIS — R7302 Impaired glucose tolerance (oral): Secondary | ICD-10-CM | POA: Diagnosis not present

## 2022-03-10 DIAGNOSIS — Z7901 Long term (current) use of anticoagulants: Secondary | ICD-10-CM | POA: Diagnosis not present

## 2022-03-11 DIAGNOSIS — C44321 Squamous cell carcinoma of skin of nose: Secondary | ICD-10-CM | POA: Diagnosis not present

## 2022-03-15 ENCOUNTER — Ambulatory Visit (HOSPITAL_COMMUNITY): Payer: Medicare Other | Admitting: Anesthesiology

## 2022-03-15 ENCOUNTER — Ambulatory Visit (HOSPITAL_BASED_OUTPATIENT_CLINIC_OR_DEPARTMENT_OTHER): Payer: Medicare Other | Admitting: Anesthesiology

## 2022-03-15 ENCOUNTER — Encounter: Payer: Self-pay | Admitting: Plastic Surgery

## 2022-03-15 ENCOUNTER — Observation Stay (HOSPITAL_COMMUNITY)
Admission: RE | Admit: 2022-03-15 | Discharge: 2022-03-16 | Disposition: A | Discharge status: Home / Self Care | Payer: Medicare Other | Attending: Plastic Surgery | Admitting: Plastic Surgery

## 2022-03-15 ENCOUNTER — Other Ambulatory Visit: Payer: Self-pay

## 2022-03-15 ENCOUNTER — Encounter (HOSPITAL_COMMUNITY)
Admission: RE | Disposition: A | Discharge status: Home / Self Care | Payer: Self-pay | Source: Home / Self Care | Attending: Plastic Surgery

## 2022-03-15 ENCOUNTER — Ambulatory Visit (INDEPENDENT_AMBULATORY_CARE_PROVIDER_SITE_OTHER): Payer: Medicare Other | Admitting: Plastic Surgery

## 2022-03-15 ENCOUNTER — Encounter (HOSPITAL_COMMUNITY): Payer: Self-pay | Admitting: Plastic Surgery

## 2022-03-15 VITALS — BP 165/77 | HR 70 | Ht 70.0 in | Wt 226.6 lb

## 2022-03-15 DIAGNOSIS — I10 Essential (primary) hypertension: Secondary | ICD-10-CM | POA: Diagnosis not present

## 2022-03-15 DIAGNOSIS — C44321 Squamous cell carcinoma of skin of nose: Secondary | ICD-10-CM

## 2022-03-15 DIAGNOSIS — M950 Acquired deformity of nose: Secondary | ICD-10-CM

## 2022-03-15 DIAGNOSIS — R58 Hemorrhage, not elsewhere classified: Secondary | ICD-10-CM | POA: Diagnosis not present

## 2022-03-15 DIAGNOSIS — I4891 Unspecified atrial fibrillation: Secondary | ICD-10-CM | POA: Diagnosis not present

## 2022-03-15 DIAGNOSIS — Z87891 Personal history of nicotine dependence: Secondary | ICD-10-CM | POA: Diagnosis not present

## 2022-03-15 DIAGNOSIS — C44320 Squamous cell carcinoma of skin of unspecified parts of face: Secondary | ICD-10-CM | POA: Diagnosis present

## 2022-03-15 DIAGNOSIS — Z9889 Other specified postprocedural states: Principal | ICD-10-CM

## 2022-03-15 DIAGNOSIS — M1990 Unspecified osteoarthritis, unspecified site: Secondary | ICD-10-CM | POA: Diagnosis not present

## 2022-03-15 HISTORY — PX: NASAL RECONSTRUCTION: SHX2069

## 2022-03-15 LAB — BASIC METABOLIC PANEL
Anion gap: 11 (ref 5–15)
BUN: 25 mg/dL — ABNORMAL HIGH (ref 8–23)
CO2: 23 mmol/L (ref 22–32)
Calcium: 8.9 mg/dL (ref 8.9–10.3)
Chloride: 97 mmol/L — ABNORMAL LOW (ref 98–111)
Creatinine, Ser: 1.27 mg/dL — ABNORMAL HIGH (ref 0.61–1.24)
GFR, Estimated: 53 mL/min — ABNORMAL LOW (ref >60)
Glucose, Bld: 108 mg/dL — ABNORMAL HIGH (ref 70–99)
Potassium: 5.2 mmol/L — ABNORMAL HIGH (ref 3.5–5.1)
Sodium: 131 mmol/L — ABNORMAL LOW (ref 135–145)

## 2022-03-15 LAB — CBC
HCT: 41.4 % (ref 39.0–52.0)
Hemoglobin: 14.8 g/dL (ref 13.0–17.0)
MCH: 38.3 pg — ABNORMAL HIGH (ref 26.0–34.0)
MCHC: 35.7 g/dL (ref 30.0–36.0)
MCV: 107.3 fL — ABNORMAL HIGH (ref 80.0–100.0)
Platelets: 280 10*3/uL (ref 150–400)
RBC: 3.86 MIL/uL — ABNORMAL LOW (ref 4.22–5.81)
RDW: 13.5 % (ref 11.5–15.5)
WBC: 6.8 10*3/uL (ref 4.0–10.5)
nRBC: 0 % (ref 0.0–0.2)

## 2022-03-15 SURGERY — RECONSTRUCTION, NOSE
Anesthesia: General | Site: Nose | Laterality: Right

## 2022-03-15 MED ORDER — LIDOCAINE-EPINEPHRINE 0.5 %-1:200000 IJ SOLN
INTRAMUSCULAR | Status: DC | PRN
  Administered 2022-03-15: 50 mL

## 2022-03-15 MED ORDER — PROPOFOL 10 MG/ML IV BOLUS
INTRAVENOUS | Status: AC
  Filled 2022-03-15: qty 20

## 2022-03-15 MED ORDER — PROPOFOL 10 MG/ML IV BOLUS
INTRAVENOUS | Status: DC | PRN
  Administered 2022-03-15: 100 mg via INTRAVENOUS

## 2022-03-15 MED ORDER — ONDANSETRON HCL 4 MG/2ML IJ SOLN: 4.0000 mg | Freq: Once | INTRAMUSCULAR | Status: DC | PRN

## 2022-03-15 MED ORDER — SUCCINYLCHOLINE CHLORIDE 200 MG/10ML IV SOSY
PREFILLED_SYRINGE | INTRAVENOUS | Status: AC
  Filled 2022-03-15: qty 10

## 2022-03-15 MED ORDER — ONDANSETRON HCL 4 MG/2ML IJ SOLN: 4.0000 mg | Freq: Four times a day (QID) | INTRAMUSCULAR | Status: DC | PRN

## 2022-03-15 MED ORDER — "THROMBI-PAD 3""X3"" EX PADS"
1.0000 | MEDICATED_PAD | Freq: Once | CUTANEOUS | Status: AC
  Administered 2022-03-15: 1 via TOPICAL
  Filled 2022-03-15: qty 1

## 2022-03-15 MED ORDER — SUGAMMADEX SODIUM 200 MG/2ML IV SOLN
INTRAVENOUS | Status: DC | PRN
  Administered 2022-03-15: 200 mg via INTRAVENOUS

## 2022-03-15 MED ORDER — CHLORHEXIDINE GLUCONATE CLOTH 2 % EX PADS: 6.0000 | MEDICATED_PAD | Freq: Once | CUTANEOUS | Status: DC

## 2022-03-15 MED ORDER — FINASTERIDE 5 MG PO TABS
5.0000 mg | ORAL_TABLET | Freq: Every day | ORAL | Status: DC
  Administered 2022-03-16: 5 mg via ORAL
  Filled 2022-03-15: qty 1

## 2022-03-15 MED ORDER — ROCURONIUM BROMIDE 10 MG/ML (PF) SYRINGE
PREFILLED_SYRINGE | INTRAVENOUS | Status: DC | PRN
  Administered 2022-03-15: 30 mg via INTRAVENOUS

## 2022-03-15 MED ORDER — PHENYLEPHRINE HCL-NACL 20-0.9 MG/250ML-% IV SOLN
INTRAVENOUS | Status: DC | PRN
  Administered 2022-03-15: 25 ug/min via INTRAVENOUS

## 2022-03-15 MED ORDER — PHENOL 1.4 % MT LIQD
1.0000 | OROMUCOSAL | Status: DC | PRN
  Filled 2022-03-15: qty 177

## 2022-03-15 MED ORDER — POLYETHYLENE GLYCOL 3350 17 G PO PACK: 17.0000 g | PACK | Freq: Every day | ORAL | Status: DC | PRN

## 2022-03-15 MED ORDER — ONDANSETRON 4 MG PO TBDP: 4.0000 mg | ORAL_TABLET | Freq: Four times a day (QID) | ORAL | Status: DC | PRN

## 2022-03-15 MED ORDER — CEFAZOLIN SODIUM-DEXTROSE 2-4 GM/100ML-% IV SOLN
2.0000 g | INTRAVENOUS | Status: AC
  Administered 2022-03-15: 2 g via INTRAVENOUS
  Filled 2022-03-15: qty 100

## 2022-03-15 MED ORDER — DEXTROSE-NACL 5-0.45 % IV SOLN: INTRAVENOUS | Status: DC

## 2022-03-15 MED ORDER — BACITRACIN ZINC 500 UNIT/GM EX OINT
TOPICAL_OINTMENT | CUTANEOUS | Status: AC
  Filled 2022-03-15: qty 28.35

## 2022-03-15 MED ORDER — MORPHINE SULFATE (PF) 2 MG/ML IV SOLN: 2.0000 mg | INTRAVENOUS | Status: DC | PRN

## 2022-03-15 MED ORDER — CHLORHEXIDINE GLUCONATE 0.12 % MT SOLN
15.0000 mL | Freq: Once | OROMUCOSAL | Status: AC
  Administered 2022-03-15: 15 mL via OROMUCOSAL
  Filled 2022-03-15: qty 15

## 2022-03-15 MED ORDER — ONDANSETRON HCL 4 MG/2ML IJ SOLN
INTRAMUSCULAR | Status: AC
  Filled 2022-03-15: qty 2

## 2022-03-15 MED ORDER — FENTANYL CITRATE (PF) 250 MCG/5ML IJ SOLN
INTRAMUSCULAR | Status: DC | PRN
  Administered 2022-03-15: 100 ug via INTRAVENOUS
  Administered 2022-03-15: 50 ug via INTRAVENOUS

## 2022-03-15 MED ORDER — ROCURONIUM BROMIDE 10 MG/ML (PF) SYRINGE
PREFILLED_SYRINGE | INTRAVENOUS | Status: AC
  Filled 2022-03-15: qty 10

## 2022-03-15 MED ORDER — LIDOCAINE-EPINEPHRINE 0.5 %-1:200000 IJ SOLN
INTRAMUSCULAR | Status: AC
  Filled 2022-03-15: qty 1

## 2022-03-15 MED ORDER — MINERAL OIL LIGHT 100 % EX OIL
TOPICAL_OIL | CUTANEOUS | Status: DC | PRN
  Administered 2022-03-15: 1 via TOPICAL

## 2022-03-15 MED ORDER — HEMOSTATIC AGENTS (NO CHARGE) OPTIME
TOPICAL | Status: DC | PRN
  Administered 2022-03-15 (×2): 1 via TOPICAL

## 2022-03-15 MED ORDER — ONDANSETRON HCL 4 MG/2ML IJ SOLN
INTRAMUSCULAR | Status: DC | PRN
  Administered 2022-03-15: 4 mg via INTRAVENOUS

## 2022-03-15 MED ORDER — FENTANYL CITRATE (PF) 100 MCG/2ML IJ SOLN: 25.0000 ug | INTRAMUSCULAR | Status: DC | PRN

## 2022-03-15 MED ORDER — DILTIAZEM HCL ER COATED BEADS 120 MG PO CP24
120.0000 mg | ORAL_CAPSULE | Freq: Every day | ORAL | Status: DC
  Administered 2022-03-16: 120 mg via ORAL
  Filled 2022-03-15: qty 1

## 2022-03-15 MED ORDER — MINERAL OIL PO OIL
TOPICAL_OIL | Freq: Once | ORAL | Status: DC
  Filled 2022-03-15: qty 30

## 2022-03-15 MED ORDER — ACETAMINOPHEN 500 MG PO TABS
1000.0000 mg | ORAL_TABLET | Freq: Four times a day (QID) | ORAL | Status: DC
  Administered 2022-03-15 – 2022-03-16 (×2): 1000 mg via ORAL
  Filled 2022-03-15 (×2): qty 2

## 2022-03-15 MED ORDER — ORAL CARE MOUTH RINSE: 15.0000 mL | Freq: Once | OROMUCOSAL | Status: AC

## 2022-03-15 MED ORDER — LACTATED RINGERS IV SOLN: INTRAVENOUS | Status: DC

## 2022-03-15 MED ORDER — OXYCODONE HCL 5 MG PO TABS: 5.0000 mg | ORAL_TABLET | ORAL | Status: DC | PRN

## 2022-03-15 MED ORDER — LIDOCAINE 2% (20 MG/ML) 5 ML SYRINGE
INTRAMUSCULAR | Status: AC
  Filled 2022-03-15: qty 5

## 2022-03-15 MED ORDER — LIDOCAINE 2% (20 MG/ML) 5 ML SYRINGE
INTRAMUSCULAR | Status: DC | PRN
  Administered 2022-03-15: 100 mg via INTRAVENOUS

## 2022-03-15 MED ORDER — SUCCINYLCHOLINE CHLORIDE 200 MG/10ML IV SOSY
PREFILLED_SYRINGE | INTRAVENOUS | Status: DC | PRN
  Administered 2022-03-15: 120 mg via INTRAVENOUS

## 2022-03-15 MED ORDER — HYDROCHLOROTHIAZIDE 25 MG PO TABS
25.0000 mg | ORAL_TABLET | Freq: Every morning | ORAL | Status: DC
  Administered 2022-03-16: 25 mg via ORAL
  Filled 2022-03-15: qty 1

## 2022-03-15 MED ORDER — FENTANYL CITRATE (PF) 250 MCG/5ML IJ SOLN
INTRAMUSCULAR | Status: AC
  Filled 2022-03-15: qty 5

## 2022-03-15 MED ORDER — TRANEXAMIC ACID-NACL 1000-0.7 MG/100ML-% IV SOLN
1000.0000 mg | Freq: Once | INTRAVENOUS | Status: AC
  Administered 2022-03-15: 1000 mg via INTRAVENOUS
  Filled 2022-03-15 (×2): qty 100

## 2022-03-15 SURGICAL SUPPLY — 46 items
APL SKNCLS STERI-STRIP NONHPOA (GAUZE/BANDAGES/DRESSINGS) ×1
APL SWBSTK 6 STRL LF DISP (MISCELLANEOUS) ×2
APPLICATOR COTTON TIP 6 STRL (MISCELLANEOUS) ×2 IMPLANT
APPLICATOR COTTON TIP 6IN STRL (MISCELLANEOUS) ×2 IMPLANT
BAG COUNTER SPONGE SURGICOUNT (BAG) ×1 IMPLANT
BAG SPNG CNTER NS LX DISP (BAG) ×1
BALL CTTN LRG ABS STRL LF (GAUZE/BANDAGES/DRESSINGS) ×4
BENZOIN TINCTURE PRP APPL 2/3 (GAUZE/BANDAGES/DRESSINGS) IMPLANT
BLADE SURG 15 STRL LF DISP TIS (BLADE) ×1 IMPLANT
BLADE SURG 15 STRL SS (BLADE) ×1
BNDG EYE OVAL (GAUZE/BANDAGES/DRESSINGS) IMPLANT
CANISTER SUCT 3000ML PPV (MISCELLANEOUS) IMPLANT
CLSR STERI-STRIP ANTIMIC 1/2X4 (GAUZE/BANDAGES/DRESSINGS) IMPLANT
CORD BIPOLAR FORCEPS 12FT (ELECTRODE) IMPLANT
COTTONBALL LRG STERILE PKG (GAUZE/BANDAGES/DRESSINGS) ×1 IMPLANT
COVER SURGICAL LIGHT HANDLE (MISCELLANEOUS) IMPLANT
DRSG OPSITE POSTOP 3X4 (GAUZE/BANDAGES/DRESSINGS) IMPLANT
ELECT NDL TIP 2.8 STRL (NEEDLE) ×1 IMPLANT
ELECT NEEDLE TIP 2.8 STRL (NEEDLE) ×2 IMPLANT
ELECT REM PT RETURN 9FT ADLT (ELECTROSURGICAL) ×1
ELECTRODE REM PT RTRN 9FT ADLT (ELECTROSURGICAL) ×1 IMPLANT
GAUZE 4X4 16PLY ~~LOC~~+RFID DBL (SPONGE) IMPLANT
GAUZE XEROFORM 5X9 LF (GAUZE/BANDAGES/DRESSINGS) ×1 IMPLANT
GLOVE BIO SURGEON STRL SZ8 (GLOVE) IMPLANT
GLOVE BIOGEL PI IND STRL 8 (GLOVE) IMPLANT
GOWN SRG XL LVL 4 BRTHBL STRL (GOWNS) IMPLANT
GOWN STRL NON-REIN XL LVL4 (GOWNS) ×1
HEMOSTAT ARISTA ABSORB 3G PWDR (HEMOSTASIS) IMPLANT
HEMOSTAT SURGICEL 2X14 (HEMOSTASIS) IMPLANT
KIT BASIN OR (CUSTOM PROCEDURE TRAY) ×1 IMPLANT
MATRIX TISSUE MESHED 4X5 (Tissue) IMPLANT
NDL HYPO 25GX1X1/2 BEV (NEEDLE) ×1 IMPLANT
NDL PRECISIONGLIDE 27X1.5 (NEEDLE) ×1 IMPLANT
NEEDLE HYPO 25GX1X1/2 BEV (NEEDLE) ×1 IMPLANT
NEEDLE PRECISIONGLIDE 27X1.5 (NEEDLE) ×1 IMPLANT
NS IRRIG 1000ML POUR BTL (IV SOLUTION) ×1 IMPLANT
PACK EENT II TURBAN DRAPE (CUSTOM PROCEDURE TRAY) ×1 IMPLANT
PENCIL BUTTON HOLSTER BLD 10FT (ELECTRODE) ×1 IMPLANT
SPONGE T-LAP 4X18 ~~LOC~~+RFID (SPONGE) IMPLANT
SUCTION FRAZIER HANDLE 10FR (MISCELLANEOUS) ×1
SUCTION TUBE FRAZIER 10FR DISP (MISCELLANEOUS) IMPLANT
SUT SILK 3 0 SH CR/8 (SUTURE) IMPLANT
SYR BULB EAR ULCER 3OZ GRN STR (SYRINGE) ×1 IMPLANT
SYR CONTROL 10ML LL (SYRINGE) ×1 IMPLANT
TOWEL GREEN STERILE FF (TOWEL DISPOSABLE) ×2 IMPLANT
TUBE CONNECTING 20X1/4 (TUBING) IMPLANT

## 2022-03-15 NOTE — Anesthesia Preprocedure Evaluation (Addendum)
Anesthesia Evaluation  Patient identified by MRN, date of birth, ID band Patient awake  General Assessment Comment:S/P MOHs repair  Reviewed: Allergy & Precautions, NPO status , Patient's Chart, lab work & pertinent test results  Airway Mallampati: II  TM Distance: >3 FB Neck ROM: Full    Dental  (+) Dental Advisory Given, Missing   Pulmonary former smoker   Pulmonary exam normal breath sounds clear to auscultation       Cardiovascular hypertension, Pt. on medications Normal cardiovascular exam+ dysrhythmias Atrial Fibrillation  Rhythm:Regular Rate:Normal     Neuro/Psych  Headaches  negative psych ROS   GI/Hepatic Neg liver ROS,GERD  ,,  Endo/Other  Obesity   Renal/GU negative Renal ROS     Musculoskeletal  (+) Arthritis ,    Abdominal   Peds  Hematology  (+) Blood dyscrasia (Xarelto)   Anesthesia Other Findings Day of surgery medications reviewed with the patient.  Reproductive/Obstetrics                             Anesthesia Physical Anesthesia Plan  ASA: 3  Anesthesia Plan: General   Post-op Pain Management: Ofirmev IV (intra-op)*   Induction: Intravenous  PONV Risk Score and Plan: 2 and Dexamethasone and Ondansetron  Airway Management Planned: Oral ETT  Additional Equipment:   Intra-op Plan:   Post-operative Plan: Extubation in OR  Informed Consent: I have reviewed the patients History and Physical, chart, labs and discussed the procedure including the risks, benefits and alternatives for the proposed anesthesia with the patient or authorized representative who has indicated his/her understanding and acceptance.     Dental advisory given  Plan Discussed with: CRNA  Anesthesia Plan Comments: (Ate lunch at 12pm.)        Anesthesia Quick Evaluation

## 2022-03-15 NOTE — Anesthesia Procedure Notes (Signed)
Procedure Name: Intubation Date/Time: 03/15/2022 6:06 PM  Performed by: Alain Marion, CRNAPre-anesthesia Checklist: Patient identified, Emergency Drugs available, Suction available and Patient being monitored Patient Re-evaluated:Patient Re-evaluated prior to induction Oxygen Delivery Method: Circle System Utilized Preoxygenation: Pre-oxygenation with 100% oxygen Induction Type: IV induction, Rapid sequence and Cricoid Pressure applied Laryngoscope Size: Miller and 2 Grade View: Grade I Tube type: Oral Tube size: 7.5 mm Number of attempts: 1 Airway Equipment and Method: Stylet Placement Confirmation: ETT inserted through vocal cords under direct vision, positive ETCO2 and breath sounds checked- equal and bilateral Secured at: 22 cm Tube secured with: Tape Dental Injury: Teeth and Oropharynx as per pre-operative assessment

## 2022-03-15 NOTE — Transfer of Care (Signed)
Immediate Anesthesia Transfer of Care Note  Patient: Stephen Choi  Procedure(s) Performed: BLOOD LOSS CONTROL, BONE BURR, APPLICATION OF INTEGRA (Right: Nose) APPLICATION OF SKIN SUBSTITUTE (Right: Nose)  Patient Location: PACU  Anesthesia Type:General  Level of Consciousness: awake, alert  and oriented  Airway & Oxygen Therapy: Patient Spontanous Breathing and Patient connected to face mask oxygen  Post-op Assessment: Report given to RN and Post -op Vital signs reviewed and stable  Post vital signs: Reviewed and stable  Last Vitals:  Vitals Value Taken Time  BP 165/90 03/15/22 1950  Temp 36.1 C 03/15/22 1950  Pulse 62 03/15/22 1954  Resp 13 03/15/22 1954  SpO2 93 % 03/15/22 1954  Vitals shown include unvalidated device data.  Last Pain:  Vitals:   03/15/22 1537  TempSrc:   PainSc: 0-No pain         Complications: No notable events documented.

## 2022-03-15 NOTE — H&P (View-Only) (Signed)
Pt arrived to unit, assessed, informed of POC

## 2022-03-15 NOTE — Discharge Instructions (Signed)
Diet: High protein, low sugar.  Medications: Do NOT restart your Xarelto until Thursday November 2nd. You may restart your other regular home medications upon discharge.  Wound care: The bleeding from your Moh's excision site was cauterized and a tissue substitute was placed and sutured in place.  We then used a bolster (comprised of the clear dressing) that was secured with stitches.  This will be removed at your two week post-op visit.  We then placed gauze over top of the bolster and Steri-Strips to secure it to the face.   There is no need to change the dressing. The only thing that you need to do is leave it alone and try to avoid sleeping on your face or provoke bleeding.  Shower: Body shower from the neck down is OK, but do not get the face wet in the shower. May clean around the surgical site with damp washcloth.   Activity: As tolerated.    Mild drainage from underneath the dressing is OK. However, please call the office should you have severe pain or swelling, uncontrolled bleeding, fevers.
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
## 2022-03-15 NOTE — Op Note (Signed)
Procedure Note  Preoperative Dx: Right nasal wall Mohs defect 6 x 3 cm,2 cm deep  Postoperative Dx: Same  Procedure: Control of bleeding, burring of nasal bone, placement of 6 x 4 segment of Integra, additional morselized integra   Anesthesia: General  Indication for Procedure: Reconstruction of defect created when squamous cell carcinoma of the right nasal side wall was resected.  Description of Procedure: Risks and complications were explained to the patient including infection and the need for additional surgeries..  Consent was confirmed and the patient understands the risks and benefits.  The potential complications and alternatives were explained and the patient consents.  The patient expressed understanding the option of not having the procedure and the risks of a scar.    Mr. Westman was taken the operating room placed in the supine position and all pressure points were padded and protected.  General anesthesia was induced without complication.  His face was prepped with Betadine and sterile drapes were applied.  A timeout was then conducted to identify the correct patient correct procedure and correct surgical site additionally we ensured that all required equipment was available.  The bleeding was controlled primarily with the use of the bipolar cautery.  Surgicel was placed in the wound and pressure was applied after approximately 10 minutes the pressure and Surgicel were removed the last bleeding points were identified and again cauterized with the bipolar cautery.  Prior to the procedure I discussed the pathology with the patient's Mohs surgeon.  He requested that I burr the 1 cm x 5 mm portion of exposed nasal bone. A 2.5 mm diamond point bur was used to accomplish this.  After the burring was complete Arista was placed in the wound.  The medial canthal tendon was intact however the lacrimal duct system was not interrogated.  A portion of Integra was then prepared and placed in the  wound along with morselized Integra the Integra was sutured to the skin edges taking care to avoid the eyelid.  Holster was then fashioned with with Adaptic and sutured in place with silk sutures sterile dressings were applied and the patient was awakened from anesthesia without incident Harout tolerated the procedure well and there were no complications.  He was transferred to recovery room in stable condition.

## 2022-03-15 NOTE — Anesthesia Postprocedure Evaluation (Signed)
Anesthesia Post Note  Patient: Stephen Choi  Procedure(s) Performed: BLOOD LOSS CONTROL, BONE BURR, APPLICATION OF INTEGRA (Right: Nose) APPLICATION OF SKIN SUBSTITUTE (Right: Nose)     Patient location during evaluation: PACU Anesthesia Type: General Level of consciousness: sedated Pain management: pain level controlled Vital Signs Assessment: post-procedure vital signs reviewed and stable Respiratory status: spontaneous breathing and respiratory function stable Cardiovascular status: stable Postop Assessment: no apparent nausea or vomiting Anesthetic complications: no   No notable events documented.  Last Vitals:  Vitals:   03/15/22 2030 03/15/22 2045  BP: (!) 136/92 (!) 152/94  Pulse: 65 63  Resp: 14 14  Temp: (!) 36.2 C   SpO2: 93% 95%    Last Pain:  Vitals:   03/15/22 2030  TempSrc:   PainSc: 0-No pain                 [name]

## 2022-03-15 NOTE — Progress Notes (Signed)
Pt arrived to unit, assessed, informed of POC

## 2022-03-15 NOTE — H&P (Signed)
Pt still has ongoing bleeding. Will proceed to the OR as planned.  No change in physical.   All questions answered. Will proceed at his request.

## 2022-03-16 ENCOUNTER — Encounter (HOSPITAL_COMMUNITY): Payer: Self-pay | Admitting: Plastic Surgery

## 2022-03-16 DIAGNOSIS — M950 Acquired deformity of nose: Secondary | ICD-10-CM | POA: Diagnosis not present

## 2022-03-16 DIAGNOSIS — C44320 Squamous cell carcinoma of skin of unspecified parts of face: Secondary | ICD-10-CM | POA: Diagnosis not present

## 2022-03-16 MED ORDER — ACETAMINOPHEN 500 MG PO TABS: 500.0000 mg | ORAL_TABLET | Freq: Four times a day (QID) | ORAL | 0 refills | Status: AC | PRN

## 2022-03-16 NOTE — Care Management CC44 (Cosign Needed)
Condition Code 44 Documentation Completed  Patient Details  Name: Stephen Choi MRN: [id number] Date of Birth: January 30, 1928   Condition Code 44 given:  Yes Patient signature on Condition Code 44 notice:  Yes Documentation of 2 MD's agreement:  Yes Code 44 added to claim:  Yes    Curlene Labrum, RN 03/16/2022, 9:32 AM

## 2022-03-16 NOTE — Plan of Care (Signed)

## 2022-03-16 NOTE — Discharge Summary (Signed)
Physician Discharge Summary  Patient ID: Stephen Choi MRN: [id number] DOB/AGE: Apr 11, 1928 88 y.o.  Admit date: 03/15/2022 Discharge date: 03/16/2022  Admission Diagnoses:  Discharge Diagnoses:  Principal Problem:   S/P nasal surgery Active Problems:   SCC (squamous cell carcinoma), face   Discharged Condition: [name] is awake and alert this morning doing well.  No complaints of pain.  No new bleeding on his dressings overnight.  He is voiding and taking p.o.  Has not ambulated yet  Hospital Course: Admitted for observation after surgery last night.  No events reported.  Consults: None  Significant Diagnostic Studies: None  Treatments: surgery: Last night we took [name] to the operating room for control of hemorrhage and placement of Integra.  He tolerated this procedure well.  No complications.  He was admitted for observation simply due to the late hour at the time the surgery concluded.  Discharge Exam: Blood pressure 138/73, pulse (!) 55, temperature 98.1 F (36.7 C), temperature source Oral, resp. rate 15, height '5\' 10"'$  (1.778 m), weight 102.5 kg, SpO2 98 %. General appearance: alert, cooperative, and oriented to person place and time. Extremities: SCDs in place no obvious swelling. Incision/Wound: Dressings are clean dry and intact.  There is no blood on the eyepatch.  Disposition:   Patient to be discharged home with companion.  He will follow-up in clinic next Monday.  He knows to call the clinic for any concerns or any bleeding after he restarts his Xarelto on Thursday.     Follow-up Information     [facility]. Schedule an appointment as soon as possible for a visit in 1 week(s).   Contact information: [address] [place] [phone]                [facility] [address] [place], [place] [address] [phone]  Signed: Camillia Herter 03/16/2022, 8:56 AM

## 2022-03-16 NOTE — Care Management Obs Status (Cosign Needed)
[facility] NOTIFICATION   Patient Details  Name: Stephen Choi MRN: [id number] Date of Birth: 20-Sep-1927   Medicare Observation Status Notification Given:  Yes    Curlene Labrum, RN 03/16/2022, 9:32 AM

## 2022-03-24 ENCOUNTER — Telehealth: Payer: Self-pay | Admitting: Radiation Oncology

## 2022-03-24 ENCOUNTER — Ambulatory Visit (INDEPENDENT_AMBULATORY_CARE_PROVIDER_SITE_OTHER): Payer: Medicare Other | Admitting: Plastic Surgery

## 2022-03-24 ENCOUNTER — Encounter: Payer: Self-pay | Admitting: Plastic Surgery

## 2022-03-24 DIAGNOSIS — C44320 Squamous cell carcinoma of skin of unspecified parts of face: Secondary | ICD-10-CM

## 2022-03-24 DIAGNOSIS — C44321 Squamous cell carcinoma of skin of nose: Secondary | ICD-10-CM

## 2022-03-24 NOTE — Telephone Encounter (Signed)
11/8 @ 9:51 am spoke to patient he declines any appointment/treatments at this time with Dr. Isidore Moos, till he speak with Dr. Lovena Le.  Send f/u email to referring physician Dr. Vernie Ammons; so they are aware.
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
## 2022-03-30 ENCOUNTER — Telehealth: Payer: Self-pay

## 2022-03-30 NOTE — Telephone Encounter (Signed)
Patient said he missed a call from Dr.Taylor, thought that it may be about the appointment he has this week. Did say he has a few questions and would like a returned call.

## 2022-03-31 NOTE — Telephone Encounter (Signed)
No issues. He will come in tomorrow.

## 2022-04-01 ENCOUNTER — Encounter: Payer: Self-pay | Admitting: Plastic Surgery

## 2022-04-01 ENCOUNTER — Ambulatory Visit (INDEPENDENT_AMBULATORY_CARE_PROVIDER_SITE_OTHER): Payer: Medicare Other | Admitting: Plastic Surgery

## 2022-04-01 VITALS — BP 150/87 | HR 73

## 2022-04-01 DIAGNOSIS — C44321 Squamous cell carcinoma of skin of nose: Secondary | ICD-10-CM | POA: Diagnosis not present

## 2022-04-01 DIAGNOSIS — C44320 Squamous cell carcinoma of skin of unspecified parts of face: Secondary | ICD-10-CM

## 2022-04-01 NOTE — Interval H&P Note (Signed)
History and Physical Interval Note:  04/01/2022 4:56 PM  Stephen Choi  has presented today for surgery, with the diagnosis of Status post [facility] repair.  The various methods of treatment have been discussed with the patient and family. After consideration of risks, benefits and other options for treatment, the patient has consented to  Procedure(s): BLOOD LOSS CONTROL, BONE BURR, APPLICATION OF INTEGRA (Right) APPLICATION OF SKIN SUBSTITUTE (Right) as a surgical intervention.  The patient's history has been reviewed, patient examined, no change in status, stable for surgery.  I have reviewed the patient's chart and labs.  Questions were answered to the patient's satisfaction.     Camillia Herter

## 2022-04-01 NOTE — Progress Notes (Signed)
[name] returns today for wound check and removal of the silicone from the Integra.  He denies problems other than drainage into his right eye.  Bandages were removed today and the silicone was removed from the Integra.  Light debridement was performed.  Removal of exudate from the edges of the wound was performed.  There appears to be approximately 50% taken of the Integra in the wound.  Did incorporate appears to be well vascularized.  The wound was redressed with Adaptic and white jelly bandage was placed over the wound.  He was shown how to manage this dressing and will change the dressing daily.  He is encouraged to wash his face and not be concerned about water getting into the wound.  [name] will follow-up with me in 2 weeks.  We will reevaluate the wound at that time and make plans for additional skin grafting.  He is to call the office for any concerns or questions.

## 2022-04-02 ENCOUNTER — Telehealth: Payer: Self-pay | Admitting: *Deleted

## 2022-04-02 NOTE — Telephone Encounter (Signed)
Faxed order to [facility] for supplies for the patient.     Supplies:Adaptic (3x3)-Daily                [place] Surgical Lubricant-Daily   Confirmation received and copy scanned into the chart.//AB/CMA

## 2022-04-05 ENCOUNTER — Telehealth: Payer: Self-pay | Admitting: *Deleted

## 2022-04-05 NOTE — Telephone Encounter (Signed)
Received on (04/02/22) via way of fax Order Status Notification from [facility].    Stating:[facility] has provided service for the patient; no further action is required.//AB/CMA

## 2022-04-07 ENCOUNTER — Encounter: Payer: Medicare Other | Admitting: Physician Assistant

## 2022-04-16 ENCOUNTER — Encounter: Payer: Self-pay | Admitting: Plastic Surgery

## 2022-04-16 ENCOUNTER — Ambulatory Visit (INDEPENDENT_AMBULATORY_CARE_PROVIDER_SITE_OTHER): Payer: Medicare Other | Admitting: Plastic Surgery

## 2022-04-16 VITALS — BP 169/83 | HR 66

## 2022-04-16 DIAGNOSIS — C44321 Squamous cell carcinoma of skin of nose: Secondary | ICD-10-CM
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
## 2022-04-21 ENCOUNTER — Telehealth: Payer: Self-pay

## 2022-04-21 ENCOUNTER — Telehealth: Payer: Self-pay | Admitting: *Deleted

## 2022-04-21 ENCOUNTER — Encounter: Payer: Self-pay | Admitting: Student

## 2022-04-21 ENCOUNTER — Ambulatory Visit (INDEPENDENT_AMBULATORY_CARE_PROVIDER_SITE_OTHER): Payer: Medicare Other | Admitting: Student

## 2022-04-21 VITALS — BP 156/76 | HR 80 | Ht 70.0 in | Wt 220.0 lb

## 2022-04-21 DIAGNOSIS — C44321 Squamous cell carcinoma of skin of nose: Secondary | ICD-10-CM

## 2022-04-21 MED ORDER — ONDANSETRON HCL 4 MG PO TABS: 4.0000 mg | ORAL_TABLET | Freq: Three times a day (TID) | ORAL | 0 refills | Status: AC | PRN
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
## 2022-04-21 NOTE — Telephone Encounter (Signed)
Spoke with patient to schedule surgery and related appts 

## 2022-04-21 NOTE — Telephone Encounter (Signed)
Faxed surgery clearance to Dr. Cathie Olden

## 2022-04-22 ENCOUNTER — Telehealth: Payer: Self-pay

## 2022-04-22 ENCOUNTER — Telehealth: Payer: Self-pay | Admitting: *Deleted

## 2022-04-22 DIAGNOSIS — I700 Atherosclerosis of aorta: Secondary | ICD-10-CM | POA: Insufficient documentation

## 2022-04-22 NOTE — Telephone Encounter (Signed)
   Name: Stephen Choi  DOB: 1928-04-25  MRN: [id number]  Primary Cardiologist: Mertie Moores, MD   Preoperative team, please contact this patient and set up a phone call appointment for further preoperative risk assessment. Please obtain consent and complete medication review. Thank you for your help.  I confirm that guidance regarding antiplatelet and oral anticoagulation therapy has been completed and, if necessary, noted below.   Per office protocol, patient can hold Xarelto for 1-2 days prior to procedure.       Lenna Sciara, NP 04/22/2022, 3:33 PM [place]

## 2022-04-22 NOTE — Telephone Encounter (Signed)
Patient with diagnosis of afib on Xarelto for anticoagulation.    Procedure: full thickness skin graft right upper lateral nasal wall and medial canthus  Date of procedure: 04/26/22  CHA2DS2-VASc Score = 4  This indicates a 4.8% annual risk of stroke. The patient's score is based upon: CHF History: 0 HTN History: 1 Diabetes History: 0 Stroke History: 0 Vascular Disease History: 1 Age Score: 2 Gender Score: 0   Aortic atherosclerosis noted on 11/2017 abdominal CT, PMH updated.  CrCl 19m/min Platelet count 280K  Per office protocol, patient can hold Xarelto for 1-2 days prior to procedure.    **This guidance is not considered finalized until pre-operative APP has relayed final recommendations.**

## 2022-04-22 NOTE — Telephone Encounter (Signed)
...     Pre-operative Risk Assessment    Patient Name: Stephen Choi  DOB: Mar 27, 1928 MRN: [id number]      Request for Surgical Clearance    Procedure:   full thickness skin graft right upper lateral nasal wall and medial canthus  Date of Surgery:  Clearance 04/26/22                             d    Surgeon:  dr Glennon Mac taylor Surgeon's Group or Practice Name:  [facility] Phone number:  [phone] Fax number:  [phone]   Type of Clearance Requested:   - Medical  - Pharmacy:  Hold Rivaroxaban (Xarelto)     Type of Anesthesia:  Not Indicated   Additional requests/questions:    Gwenlyn Found   04/22/2022, 2:27 PM

## 2022-04-22 NOTE — Telephone Encounter (Signed)
  Patient Consent for Virtual Visit         Stephen Choi has provided verbal consent on 04/22/2022 for a virtual visit (video or telephone).   CONSENT FOR VIRTUAL VISIT FOR:  Stephen Choi  By participating in this virtual visit I agree to the following:  I hereby voluntarily request, consent and authorize [facility] and its employed or contracted physicians, physician assistants, nurse practitioners or other licensed health care professionals (the Practitioner), to provide me with telemedicine health care services (the "Services") as deemed necessary by the treating Practitioner. I acknowledge and consent to receive the Services by the Practitioner via telemedicine. I understand that the telemedicine visit will involve communicating with the Practitioner through live audiovisual communication technology and the disclosure of certain medical information by electronic transmission. I acknowledge that I have been given the opportunity to request an in-person assessment or other available alternative prior to the telemedicine visit and am voluntarily participating in the telemedicine visit.  I understand that I have the right to withhold or withdraw my consent to the use of telemedicine in the course of my care at any time, without affecting my right to future care or treatment, and that the Practitioner or I may terminate the telemedicine visit at any time. I understand that I have the right to inspect all information obtained and/or recorded in the course of the telemedicine visit and may receive copies of available information for a reasonable fee.  I understand that some of the potential risks of receiving the Services via telemedicine include:  Delay or interruption in medical evaluation due to technological equipment failure or disruption; Information transmitted may not be sufficient (e.g. poor resolution of images) to allow for appropriate medical decision making by the  Practitioner; and/or  In rare instances, security protocols could fail, causing a breach of personal health information.  Furthermore, I acknowledge that it is my responsibility to provide information about my medical history, conditions and care that is complete and accurate to the best of my ability. I acknowledge that Practitioner's advice, recommendations, and/or decision may be based on factors not within their control, such as incomplete or inaccurate data provided by me or distortions of diagnostic images or specimens that may result from electronic transmissions. I understand that the practice of medicine is not an exact science and that Practitioner makes no warranties or guarantees regarding treatment outcomes. I acknowledge that a copy of this consent can be made available to me via my patient portal ([facility]), or I can request a printed copy by calling the office of [facility].    I understand that my insurance will be billed for this visit.   I have read or had this consent read to me. I understand the contents of this consent, which adequately explains the benefits and risks of the Services being provided via telemedicine.  I have been provided ample opportunity to ask questions regarding this consent and the Services and have had my questions answered to my satisfaction. I give my informed consent for the services to be provided through the use of telemedicine in my medical care

## 2022-04-23 ENCOUNTER — Ambulatory Visit: Payer: Medicare Other | Attending: Internal Medicine | Admitting: Nurse Practitioner

## 2022-04-23 DIAGNOSIS — Z01810 Encounter for preprocedural cardiovascular examination: Secondary | ICD-10-CM
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
## 2022-04-26 ENCOUNTER — Encounter: Payer: Self-pay | Admitting: Student

## 2022-04-26 DIAGNOSIS — Z85828 Personal history of other malignant neoplasm of skin: Secondary | ICD-10-CM | POA: Diagnosis not present

## 2022-04-26 DIAGNOSIS — Z483 Aftercare following surgery for neoplasm: Secondary | ICD-10-CM | POA: Diagnosis not present

## 2022-04-26 DIAGNOSIS — Z481 Encounter for planned postprocedural wound closure: Secondary | ICD-10-CM | POA: Diagnosis not present

## 2022-04-26 DIAGNOSIS — S0120XD Unspecified open wound of nose, subsequent encounter: Secondary | ICD-10-CM | POA: Diagnosis not present

## 2022-04-26 NOTE — Progress Notes (Signed)
Surgical Clearance has been received from patient's cardiology provider, Diona Browner, NP, for patient's upcoming surgery with Dr. Lovena Le .  Per Diona Browner, NP, patient is at an acceptable risk for the planned procedure without further cardiovascular testing.

## 2022-04-27 ENCOUNTER — Telehealth: Payer: Self-pay

## 2022-04-27 NOTE — Telephone Encounter (Signed)
Hi Lauren,  No, she should not touch anything with his dressing. His only instructions are to keep the bolster dry.   Thank you,  Donna Christen

## 2022-04-27 NOTE — Telephone Encounter (Signed)
Patient's wife is calling in stating when the first surgery happened she was putting on KYW gel, wants to know if when she changes Hewitt's bandage today  if she needs to apply anything on it.

## 2022-04-27 NOTE — Telephone Encounter (Signed)
Spoke with patient - all set. Coming in Thursday.

## 2022-04-27 NOTE — Telephone Encounter (Signed)
Called patient and gave message below, states on the aftervisit it says to change bandage daily. Did advise not to touch the dressing, wife said she was going to change it anyway but would like to hear back from [place]. I did advise wife and patient to not touch the bandage until they get clarification.

## 2022-04-29 ENCOUNTER — Encounter: Payer: Self-pay | Admitting: Student

## 2022-04-29 ENCOUNTER — Encounter: Payer: Medicare Other | Admitting: Student

## 2022-04-29 ENCOUNTER — Ambulatory Visit (INDEPENDENT_AMBULATORY_CARE_PROVIDER_SITE_OTHER): Payer: Medicare Other | Admitting: Physician Assistant

## 2022-04-29 VITALS — BP 167/75 | HR 64

## 2022-04-29 DIAGNOSIS — C44321 Squamous cell carcinoma of skin of nose: Secondary | ICD-10-CM

## 2022-04-29 NOTE — Progress Notes (Signed)
Patient is a 88 year old male with history of squamous cell carcinoma.  He recently underwent full-thickness skin graft to the right upper lateral nasal wall and medial canthus with Dr. Lovena Le on 04/26/2022.  Patient presents to the clinic for postoperative follow-up today.  Today notes that he is doing very well, he notes that the bandage was bothering him a little bit but otherwise has no pain.  He notes bruising along his neck around the donor site.  He denies any airway issues.  Denies any fever.  On exam bolster in place with no surrounding redness discharge.  Right anterior neck with proline in place clean dry and intact incision with no swelling or edema, he does have ecchymosis around the anterior neck with no significant palpable hematomas.  Overall the patient is doing very well, Dr. Lovena Le at the opportunity to evaluate the patient.  Would like to see him back in our office early next week for bolster removal.  He was given strict return precautions.  Verbalized understanding and agreement to this plan.  His blood pressure was slightly elevated today, he notes this is abnormal for him, he did have his blood pressure taken immediately after sitting down and after walking with his walker.  He will check it at home and follow-up with his primary care if remains elevated.

## 2022-05-03 ENCOUNTER — Encounter: Payer: Self-pay | Admitting: Student

## 2022-05-03 ENCOUNTER — Ambulatory Visit (INDEPENDENT_AMBULATORY_CARE_PROVIDER_SITE_OTHER): Payer: Medicare Other | Admitting: Student

## 2022-05-03 VITALS — BP 161/77 | HR 69

## 2022-05-03 DIAGNOSIS — C44321 Squamous cell carcinoma of skin of nose: Secondary | ICD-10-CM

## 2022-05-03 NOTE — Progress Notes (Signed)
Patient is a 88 year old male with history of squamous cell carcinoma.  He recently underwent full-thickness skin graft to the right upper lateral nasal wall and medial canthus with Dr. Lovena Le on 04/26/2022.  Patient presents to the clinic today for postoperative follow-up.  Patient was last seen on 04/29/2022.  At this visit, he was doing well.  Bolster was in place.  Right anterior neck incision appeared to be intact.  Plan is for patient to follow-up the next week for bolster removal.  Patient is accompanied by his wife at bedside.  Today, patient reports he is doing well.  He denies any new issues or concerns.  On exam, patient is sitting upright in no acute distress.  Right anterior neck incision is intact with Prolene sutures.  There is no surrounding erythema or drainage.  The bolster is in place to the right lateral nasal wall.  There is no surrounding erythema or drainage.  Bolster was cut and removed.  Patient tolerated well.  Graft appears to have taken to part of the area.  Centrally, there is a little bit of a purpleish hue.  It appears to be superficial.  There is some darkening around the periphery of the graft that is consistent with either dried blood or eschar.  Dr. Lovena Le also had the opportunity to examine the patient and discussed the plan with them.  We discussed with the patient that there could possibly be some necrosis to the periphery and to the superficial aspect of the graft.  We discussed with the patient that the top layer of the graft may slough off.  Patient expressed understanding.  We discussed with the patient that he can apply Vaseline daily over the graft site.  We discussed with the patient that he can apply moist gauze dressings for a short amount of time over the area to help remove the eschar versus dried blood to the periphery of the graft.  We cautioned the patient to use extreme caution while applying Vaseline or applying the gauze as rubbing can disrupt the  graft.  Patient expressed understanding.  I discussed with the patient that he should avoid getting the graft wet in the shower as this can disrupt the graft.  Patient is to follow-up next week for Prolene suture removal to the right neck donor site.  I instructed the patient and the patient's wife to call in the meantime if they have any questions or concerns.  Pictures were obtained of the patient and placed in the chart with the patient's or guardian's permission.

## 2022-05-12 ENCOUNTER — Ambulatory Visit (INDEPENDENT_AMBULATORY_CARE_PROVIDER_SITE_OTHER): Payer: Medicare Other | Admitting: Student

## 2022-05-12 VITALS — BP 151/73 | HR 50

## 2022-05-12 DIAGNOSIS — C44321 Squamous cell carcinoma of skin of nose: Secondary | ICD-10-CM

## 2022-05-12 NOTE — Progress Notes (Signed)
Patient is a 88 year old male with history of squamous cell carcinoma.  He underwent full-thickness skin graft to the right upper lateral nasal wall and medial canthus with Dr. Lovena Le on 04/26/2022.  Patient presents to the clinic for postoperative follow-up.  Patient was last seen in the clinic on 05/03/2022.  At that visit, patient reported he was doing well.  The right anterior neck incision was intact with Prolene sutures.  The bolster to the right lateral nasal wall was removed.  Graft appears to have taken to part of the wound.  Plan was for patient to apply Vaseline daily over the graft site and apply some moist dressings over the area as well.  Patient was to follow-up in 1 week for removal of sutures and reevaluation.  Patient is accompanied by his wife at bedside.  Today, patient reports he is doing well.  He states he would like the Prolene sutures removed today.  He denies any other issues complaints or concerns.  Patient's wife states that they have been placing Vaseline daily on the graft site.  On exam, patient is sitting upright in no acute distress.  Right neck incision is intact with Prolene sutures.  There is some mild irritation noted.  There is no swelling or drainage noted.  Prolene sutures were removed without difficulty.  Patient tolerated well.  To the graft site, there was some exudate /superficial sloughing to the area.  There is no surrounding erythema.  There was a slight odor noted.  Dr. Lovena Le had the opportunity to examine the patient.  During today's visit, Dr. Lovena Le debrided some of the exudate and slough.  Patient tolerated well.  After the debridement, it appears that there is healthy granulation tissue noted.  We discussed with the patient and the patient's wife that they should apply a very dilute solution of hydrogen peroxide (1 part hydrogen peroxide, 9 parts saline) to gauze and soak the graft site for a few minutes daily to help remove any remaining exudate or  slough.  I discussed with the patient that he should apply Vaseline daily to the right neck incision.  Patient and patient's wife expressed understanding.  Patient to follow-up next Tuesday for reevaluation.  I instructed the patient and patient's wife to call in the meantime if they have any questions or concerns.  Pictures were obtained of the patient and placed in the chart with the patient's or guardian's permission.

## 2022-05-14 ENCOUNTER — Encounter: Payer: Medicare Other | Admitting: Plastic Surgery

## 2022-05-19 ENCOUNTER — Ambulatory Visit (INDEPENDENT_AMBULATORY_CARE_PROVIDER_SITE_OTHER): Payer: Medicare Other | Admitting: Plastic Surgery

## 2022-05-19 ENCOUNTER — Encounter: Payer: Self-pay | Admitting: Plastic Surgery

## 2022-05-19 VITALS — BP 138/73 | HR 52

## 2022-05-19 DIAGNOSIS — Z9889 Other specified postprocedural states: Secondary | ICD-10-CM

## 2022-05-19 DIAGNOSIS — C44321 Squamous cell carcinoma of skin of nose: Secondary | ICD-10-CM

## 2022-05-19 NOTE — Progress Notes (Signed)
Stephen Choi returns the skin graft on the nose.  He has no specific complaints feels he is doing well and is happy with the result.  On examination there is a small amount of crusting on the skin graft however the graft appears healthy and is doing well.  There is 1 suture at the superior aspect of the skin graft which I removed.  I request that he begin massaging the area gently with moisturizing cream twice a day.  He is to stop using the peroxide on the wound.  In 1 month.

## 2022-05-31 ENCOUNTER — Ambulatory Visit (INDEPENDENT_AMBULATORY_CARE_PROVIDER_SITE_OTHER): Payer: Medicare Other | Admitting: Student

## 2022-05-31 ENCOUNTER — Encounter: Payer: Self-pay | Admitting: Student

## 2022-05-31 ENCOUNTER — Encounter: Payer: Medicare Other | Admitting: Student

## 2022-05-31 VITALS — BP 167/76 | HR 56

## 2022-05-31 DIAGNOSIS — C44321 Squamous cell carcinoma of skin of nose: Secondary | ICD-10-CM

## 2022-05-31 NOTE — Progress Notes (Signed)
Patient is a 89 year old male with history of squamous cell carcinoma.  He underwent full-thickness skin graft to the right upper lateral nasal wall and medial canthus with Dr. Lovena Le on 04/26/2022.  Patient presents to the clinic today for postoperative follow-up.  Patient was last seen on 05/19/2022.  At this visit, he was doing well.  There is a small amount of crusting on the graft, but the graft appeared to be healthy and doing well.  Plan was for patient to continue to massage the area with moisturizing cream twice a day.  Today, patient is accompanied by his wife.  He has no new complaints or concerns.  He states that he has been applying moisturizing cream to the area.  He denies any issues or concerns with the graft site.  On exam, patient is sitting upright in no acute distress.  To the graft site, there appears to be some scabbing, more so noted to the medial/inferior aspect of the graft.  Graft otherwise appears to be intact.  There is no drainage.  There is no surrounding erythema.  There are no signs of infection on exam.  I discussed with the patient and the patient's wife that they should continue with either moisturizer or Vaseline daily to the graft site.  I discussed with the patient and the patient's wife that the scabbing should eventually slough off.  Patient and patient's wife expressed understanding.  Patient to follow-up in 3 weeks.  I instructed the patient and the patient's wife to call in the meantime if they have any questions or concerns.  Pictures were obtained of the patient and placed in the chart with the patient's or guardian's permission.

## 2022-06-17 ENCOUNTER — Encounter: Payer: Medicare Other | Admitting: Plastic Surgery

## 2022-06-23 NOTE — Progress Notes (Signed)
Patient is a 89 year old male with history of squamous cell carcinoma.  He underwent full-thickness skin graft to the right upper lateral nasal wall and medial canthus with Dr. Lovena Le on 04/26/2022.  Patient presents to the clinic today for postoperative follow-up.  Patient was last seen in the clinic on 05/31/2022.  At that time he was doing well, there was some scabbing to the graft, it was recommended to continue with Vaseline.  He presents today with his partner, reports he is doing really well.  He is very pleased with how the area has healed, has some questions about if it will flatten and soften over time. He is not having any issues at this point.  He denies any issues with opening or closing his eyelids, no issues with tear ducts draining He does report that he has a skin tag of his left eye that he is bothered by, would like this removed if possible.  On exam patient is sitting upright, no acute distress, graft site has completely epithelialized and healed well.  There is no erythema or cellulitic changes noted.  The patient has normal function of his right upper and lower eyelids. He does have a skin tag of the left lower lateral eyelid just inferior to the lateral canthus.  There is no surrounding erythema.  A/P:  Recommend light massage to the skin graft of his right nose/medial canthus, discussed that light massage will continue to soften up the graft and over time the graft will flatten out.  There is no signs of infection on exam.  We discussed strict use of sunscreen to prevent hyperpigmentation and decrease risk of complications.    The left lower eyelid skin tag was removed, Dr. Lovena Le was present.  Sterile technique was used.  Patient tolerated this well.  Bleeding was controlled with pressure and silver nitrate stick.   Dr. Lovena Le also had the opportunity to evaluate the patient, we will plan to see him in follow-up as needed.  Recommend calling with questions or  concerns.  Pictures were obtained of the patient and placed in the chart with the patient's or guardian's permission.

## 2022-06-24 ENCOUNTER — Encounter: Payer: Medicare Other | Admitting: Plastic Surgery

## 2022-06-24 ENCOUNTER — Ambulatory Visit (INDEPENDENT_AMBULATORY_CARE_PROVIDER_SITE_OTHER): Payer: Medicare Other | Admitting: Surgical

## 2022-06-24 ENCOUNTER — Encounter: Payer: Self-pay | Admitting: Student

## 2022-06-24 VITALS — BP 159/85 | HR 57

## 2022-06-24 DIAGNOSIS — C44321 Squamous cell carcinoma of skin of nose: Secondary | ICD-10-CM

## 2022-06-24 DIAGNOSIS — C44320 Squamous cell carcinoma of skin of unspecified parts of face: Secondary | ICD-10-CM

## 2022-07-12 ENCOUNTER — Ambulatory Visit: Payer: Medicare Other | Admitting: Dermatology

## 2022-07-13 DIAGNOSIS — R310 Gross hematuria: Secondary | ICD-10-CM | POA: Diagnosis not present

## 2022-07-14 DIAGNOSIS — I872 Venous insufficiency (chronic) (peripheral): Secondary | ICD-10-CM | POA: Diagnosis not present

## 2022-07-28 DIAGNOSIS — H35363 Drusen (degenerative) of macula, bilateral: Secondary | ICD-10-CM | POA: Diagnosis not present

## 2022-07-28 DIAGNOSIS — H11042 Peripheral pterygium, stationary, left eye: Secondary | ICD-10-CM | POA: Diagnosis not present

## 2022-07-28 DIAGNOSIS — H40013 Open angle with borderline findings, low risk, bilateral: Secondary | ICD-10-CM | POA: Diagnosis not present

## 2022-07-28 DIAGNOSIS — D3131 Benign neoplasm of right choroid: Secondary | ICD-10-CM | POA: Diagnosis not present

## 2022-07-28 DIAGNOSIS — H04123 Dry eye syndrome of bilateral lacrimal glands: Secondary | ICD-10-CM | POA: Diagnosis not present

## 2022-08-06 DIAGNOSIS — M25552 Pain in left hip: Secondary | ICD-10-CM | POA: Diagnosis not present

## 2022-08-25 DIAGNOSIS — I872 Venous insufficiency (chronic) (peripheral): Secondary | ICD-10-CM | POA: Diagnosis not present

## 2022-08-25 DIAGNOSIS — L97319 Non-pressure chronic ulcer of right ankle with unspecified severity: Secondary | ICD-10-CM | POA: Diagnosis not present

## 2022-08-25 DIAGNOSIS — L570 Actinic keratosis: Secondary | ICD-10-CM | POA: Diagnosis not present

## 2022-08-28 DIAGNOSIS — Z23 Encounter for immunization: Secondary | ICD-10-CM | POA: Diagnosis not present

## 2022-09-06 DIAGNOSIS — I10 Essential (primary) hypertension: Secondary | ICD-10-CM | POA: Diagnosis not present

## 2022-09-06 DIAGNOSIS — R7302 Impaired glucose tolerance (oral): Secondary | ICD-10-CM | POA: Diagnosis not present

## 2022-09-06 DIAGNOSIS — E669 Obesity, unspecified: Secondary | ICD-10-CM | POA: Diagnosis not present

## 2022-09-06 DIAGNOSIS — N401 Enlarged prostate with lower urinary tract symptoms: Secondary | ICD-10-CM | POA: Diagnosis not present

## 2022-09-06 DIAGNOSIS — M4800 Spinal stenosis, site unspecified: Secondary | ICD-10-CM | POA: Diagnosis not present

## 2022-09-06 DIAGNOSIS — I480 Paroxysmal atrial fibrillation: Secondary | ICD-10-CM | POA: Diagnosis not present

## 2022-09-06 DIAGNOSIS — Z7901 Long term (current) use of anticoagulants: Secondary | ICD-10-CM | POA: Diagnosis not present

## 2022-09-16 DIAGNOSIS — L97319 Non-pressure chronic ulcer of right ankle with unspecified severity: Secondary | ICD-10-CM | POA: Diagnosis not present

## 2022-09-16 DIAGNOSIS — I872 Venous insufficiency (chronic) (peripheral): Secondary | ICD-10-CM | POA: Diagnosis not present

## 2022-11-22 DIAGNOSIS — L821 Other seborrheic keratosis: Secondary | ICD-10-CM | POA: Diagnosis not present

## 2022-11-22 DIAGNOSIS — L97319 Non-pressure chronic ulcer of right ankle with unspecified severity: Secondary | ICD-10-CM | POA: Diagnosis not present

## 2022-11-22 DIAGNOSIS — L570 Actinic keratosis: Secondary | ICD-10-CM | POA: Diagnosis not present

## 2022-12-16 DIAGNOSIS — R310 Gross hematuria: Secondary | ICD-10-CM | POA: Diagnosis not present

## 2022-12-16 DIAGNOSIS — L97329 Non-pressure chronic ulcer of left ankle with unspecified severity: Secondary | ICD-10-CM | POA: Diagnosis not present

## 2022-12-16 DIAGNOSIS — N400 Enlarged prostate without lower urinary tract symptoms: Secondary | ICD-10-CM | POA: Diagnosis not present

## 2023-01-19 DIAGNOSIS — H40013 Open angle with borderline findings, low risk, bilateral: Secondary | ICD-10-CM | POA: Diagnosis not present

## 2023-01-19 DIAGNOSIS — H02204 Unspecified lagophthalmos left upper eyelid: Secondary | ICD-10-CM | POA: Diagnosis not present

## 2023-01-19 DIAGNOSIS — H01001 Unspecified blepharitis right upper eyelid: Secondary | ICD-10-CM | POA: Diagnosis not present

## 2023-01-19 DIAGNOSIS — H02201 Unspecified lagophthalmos right upper eyelid: Secondary | ICD-10-CM | POA: Diagnosis not present

## 2023-02-21 DIAGNOSIS — L570 Actinic keratosis: Secondary | ICD-10-CM | POA: Diagnosis not present

## 2023-03-16 DIAGNOSIS — R7301 Impaired fasting glucose: Secondary | ICD-10-CM | POA: Diagnosis not present

## 2023-03-16 DIAGNOSIS — N401 Enlarged prostate with lower urinary tract symptoms: Secondary | ICD-10-CM | POA: Diagnosis not present

## 2023-03-16 DIAGNOSIS — I10 Essential (primary) hypertension: Secondary | ICD-10-CM | POA: Diagnosis not present

## 2023-03-16 DIAGNOSIS — E785 Hyperlipidemia, unspecified: Secondary | ICD-10-CM | POA: Diagnosis not present

## 2023-03-16 DIAGNOSIS — Z1389 Encounter for screening for other disorder: Secondary | ICD-10-CM | POA: Diagnosis not present

## 2023-03-22 DIAGNOSIS — L570 Actinic keratosis: Secondary | ICD-10-CM | POA: Diagnosis not present

## 2023-03-23 DIAGNOSIS — R7302 Impaired glucose tolerance (oral): Secondary | ICD-10-CM | POA: Diagnosis not present

## 2023-03-23 DIAGNOSIS — M4800 Spinal stenosis, site unspecified: Secondary | ICD-10-CM | POA: Diagnosis not present

## 2023-03-23 DIAGNOSIS — Z1339 Encounter for screening examination for other mental health and behavioral disorders: Secondary | ICD-10-CM | POA: Diagnosis not present

## 2023-03-23 DIAGNOSIS — Z7901 Long term (current) use of anticoagulants: Secondary | ICD-10-CM | POA: Diagnosis not present

## 2023-03-23 DIAGNOSIS — I10 Essential (primary) hypertension: Secondary | ICD-10-CM | POA: Diagnosis not present

## 2023-03-23 DIAGNOSIS — E785 Hyperlipidemia, unspecified: Secondary | ICD-10-CM | POA: Diagnosis not present

## 2023-03-23 DIAGNOSIS — I480 Paroxysmal atrial fibrillation: Secondary | ICD-10-CM | POA: Diagnosis not present

## 2023-03-23 DIAGNOSIS — Z1331 Encounter for screening for depression: Secondary | ICD-10-CM | POA: Diagnosis not present

## 2023-03-23 DIAGNOSIS — Z23 Encounter for immunization: Secondary | ICD-10-CM | POA: Diagnosis not present

## 2023-03-23 DIAGNOSIS — Z0000 Encounter for general adult medical examination without abnormal findings: Secondary | ICD-10-CM | POA: Diagnosis not present

## 2023-03-23 DIAGNOSIS — R82998 Other abnormal findings in urine: Secondary | ICD-10-CM | POA: Diagnosis not present

## 2023-03-23 DIAGNOSIS — N401 Enlarged prostate with lower urinary tract symptoms: Secondary | ICD-10-CM | POA: Diagnosis not present

## 2023-03-23 DIAGNOSIS — E669 Obesity, unspecified: Secondary | ICD-10-CM | POA: Diagnosis not present

## 2023-04-21 DIAGNOSIS — L570 Actinic keratosis: Secondary | ICD-10-CM | POA: Diagnosis not present

## 2023-04-21 DIAGNOSIS — D485 Neoplasm of uncertain behavior of skin: Secondary | ICD-10-CM | POA: Diagnosis not present

## 2023-04-21 DIAGNOSIS — C44529 Squamous cell carcinoma of skin of other part of trunk: Secondary | ICD-10-CM | POA: Diagnosis not present

## 2023-05-03 DIAGNOSIS — C44529 Squamous cell carcinoma of skin of other part of trunk: Secondary | ICD-10-CM | POA: Diagnosis not present

## 2023-06-06 DIAGNOSIS — M25552 Pain in left hip: Secondary | ICD-10-CM | POA: Diagnosis not present

## 2023-06-06 DIAGNOSIS — M545 Low back pain, unspecified: Secondary | ICD-10-CM | POA: Diagnosis not present

## 2023-06-10 DIAGNOSIS — M545 Low back pain, unspecified: Secondary | ICD-10-CM | POA: Diagnosis not present

## 2023-06-14 DIAGNOSIS — M545 Low back pain, unspecified: Secondary | ICD-10-CM | POA: Diagnosis not present

## 2023-06-14 DIAGNOSIS — M791 Myalgia, unspecified site: Secondary | ICD-10-CM | POA: Diagnosis not present

## 2023-06-23 DIAGNOSIS — M5442 Lumbago with sciatica, left side: Secondary | ICD-10-CM | POA: Diagnosis not present

## 2023-06-23 DIAGNOSIS — M545 Low back pain, unspecified: Secondary | ICD-10-CM | POA: Diagnosis not present

## 2023-06-23 DIAGNOSIS — M48061 Spinal stenosis, lumbar region without neurogenic claudication: Secondary | ICD-10-CM | POA: Diagnosis not present

## 2023-06-28 DIAGNOSIS — M6281 Muscle weakness (generalized): Secondary | ICD-10-CM | POA: Diagnosis not present

## 2023-06-28 DIAGNOSIS — M48061 Spinal stenosis, lumbar region without neurogenic claudication: Secondary | ICD-10-CM | POA: Diagnosis not present

## 2023-06-30 DIAGNOSIS — M48061 Spinal stenosis, lumbar region without neurogenic claudication: Secondary | ICD-10-CM | POA: Diagnosis not present

## 2023-06-30 DIAGNOSIS — M6281 Muscle weakness (generalized): Secondary | ICD-10-CM | POA: Diagnosis not present

## 2023-07-05 DIAGNOSIS — M6281 Muscle weakness (generalized): Secondary | ICD-10-CM | POA: Diagnosis not present

## 2023-07-05 DIAGNOSIS — M48061 Spinal stenosis, lumbar region without neurogenic claudication: Secondary | ICD-10-CM | POA: Diagnosis not present

## 2023-08-09 DIAGNOSIS — H01004 Unspecified blepharitis left upper eyelid: Secondary | ICD-10-CM | POA: Diagnosis not present

## 2023-08-09 DIAGNOSIS — H01001 Unspecified blepharitis right upper eyelid: Secondary | ICD-10-CM | POA: Diagnosis not present

## 2023-08-09 DIAGNOSIS — H35363 Drusen (degenerative) of macula, bilateral: Secondary | ICD-10-CM | POA: Diagnosis not present

## 2023-08-09 DIAGNOSIS — H40013 Open angle with borderline findings, low risk, bilateral: Secondary | ICD-10-CM | POA: Diagnosis not present

## 2023-08-18 DIAGNOSIS — R195 Other fecal abnormalities: Secondary | ICD-10-CM | POA: Diagnosis not present

## 2023-08-18 DIAGNOSIS — E669 Obesity, unspecified: Secondary | ICD-10-CM | POA: Diagnosis not present

## 2023-08-18 DIAGNOSIS — I480 Paroxysmal atrial fibrillation: Secondary | ICD-10-CM | POA: Diagnosis not present

## 2023-08-18 DIAGNOSIS — K5790 Diverticulosis of intestine, part unspecified, without perforation or abscess without bleeding: Secondary | ICD-10-CM | POA: Diagnosis not present

## 2023-08-18 DIAGNOSIS — I10 Essential (primary) hypertension: Secondary | ICD-10-CM | POA: Diagnosis not present

## 2023-08-18 DIAGNOSIS — K5900 Constipation, unspecified: Secondary | ICD-10-CM | POA: Diagnosis not present

## 2023-08-18 DIAGNOSIS — K648 Other hemorrhoids: Secondary | ICD-10-CM | POA: Diagnosis not present

## 2023-08-18 DIAGNOSIS — Z7901 Long term (current) use of anticoagulants: Secondary | ICD-10-CM | POA: Diagnosis not present

## 2023-08-23 DIAGNOSIS — Z789 Other specified health status: Secondary | ICD-10-CM | POA: Diagnosis not present

## 2023-08-23 DIAGNOSIS — S81801A Unspecified open wound, right lower leg, initial encounter: Secondary | ICD-10-CM | POA: Diagnosis not present

## 2023-08-23 DIAGNOSIS — D485 Neoplasm of uncertain behavior of skin: Secondary | ICD-10-CM | POA: Diagnosis not present

## 2023-08-23 DIAGNOSIS — R195 Other fecal abnormalities: Secondary | ICD-10-CM | POA: Diagnosis not present

## 2023-08-23 DIAGNOSIS — L820 Inflamed seborrheic keratosis: Secondary | ICD-10-CM | POA: Diagnosis not present

## 2023-08-23 DIAGNOSIS — L538 Other specified erythematous conditions: Secondary | ICD-10-CM | POA: Diagnosis not present

## 2023-08-23 DIAGNOSIS — L2989 Other pruritus: Secondary | ICD-10-CM | POA: Diagnosis not present

## 2023-08-23 DIAGNOSIS — S81802A Unspecified open wound, left lower leg, initial encounter: Secondary | ICD-10-CM | POA: Diagnosis not present

## 2023-08-23 DIAGNOSIS — C44629 Squamous cell carcinoma of skin of left upper limb, including shoulder: Secondary | ICD-10-CM | POA: Diagnosis not present

## 2023-09-01 DIAGNOSIS — R5383 Other fatigue: Secondary | ICD-10-CM | POA: Diagnosis not present

## 2023-09-01 DIAGNOSIS — J0100 Acute maxillary sinusitis, unspecified: Secondary | ICD-10-CM | POA: Diagnosis not present

## 2023-09-01 DIAGNOSIS — R059 Cough, unspecified: Secondary | ICD-10-CM | POA: Diagnosis not present

## 2023-09-01 DIAGNOSIS — Z1152 Encounter for screening for COVID-19: Secondary | ICD-10-CM | POA: Diagnosis not present

## 2023-09-01 DIAGNOSIS — R519 Headache, unspecified: Secondary | ICD-10-CM | POA: Diagnosis not present

## 2023-09-01 DIAGNOSIS — J029 Acute pharyngitis, unspecified: Secondary | ICD-10-CM | POA: Diagnosis not present

## 2023-09-05 DIAGNOSIS — R058 Other specified cough: Secondary | ICD-10-CM | POA: Diagnosis not present

## 2023-09-05 DIAGNOSIS — J189 Pneumonia, unspecified organism: Secondary | ICD-10-CM | POA: Diagnosis not present

## 2023-09-05 DIAGNOSIS — Z7901 Long term (current) use of anticoagulants: Secondary | ICD-10-CM | POA: Diagnosis not present

## 2023-09-05 DIAGNOSIS — I480 Paroxysmal atrial fibrillation: Secondary | ICD-10-CM | POA: Diagnosis not present

## 2023-09-13 DIAGNOSIS — I480 Paroxysmal atrial fibrillation: Secondary | ICD-10-CM | POA: Diagnosis not present

## 2023-09-13 DIAGNOSIS — Z7901 Long term (current) use of anticoagulants: Secondary | ICD-10-CM | POA: Diagnosis not present

## 2023-09-13 DIAGNOSIS — R051 Acute cough: Secondary | ICD-10-CM | POA: Diagnosis not present

## 2023-09-13 DIAGNOSIS — J189 Pneumonia, unspecified organism: Secondary | ICD-10-CM | POA: Diagnosis not present

## 2023-09-14 ENCOUNTER — Ambulatory Visit (HOSPITAL_BASED_OUTPATIENT_CLINIC_OR_DEPARTMENT_OTHER): Admitting: General Surgery

## 2023-09-20 DIAGNOSIS — C44629 Squamous cell carcinoma of skin of left upper limb, including shoulder: Secondary | ICD-10-CM | POA: Diagnosis not present

## 2023-09-28 ENCOUNTER — Encounter (HOSPITAL_BASED_OUTPATIENT_CLINIC_OR_DEPARTMENT_OTHER): Attending: Internal Medicine | Admitting: Internal Medicine

## 2023-09-28 DIAGNOSIS — L97322 Non-pressure chronic ulcer of left ankle with fat layer exposed: Secondary | ICD-10-CM | POA: Diagnosis not present

## 2023-09-28 DIAGNOSIS — Z85828 Personal history of other malignant neoplasm of skin: Secondary | ICD-10-CM | POA: Diagnosis not present

## 2023-09-28 DIAGNOSIS — L97219 Non-pressure chronic ulcer of right calf with unspecified severity: Secondary | ICD-10-CM | POA: Diagnosis not present

## 2023-09-28 DIAGNOSIS — L97229 Non-pressure chronic ulcer of left calf with unspecified severity: Secondary | ICD-10-CM | POA: Insufficient documentation

## 2023-09-28 DIAGNOSIS — I87333 Chronic venous hypertension (idiopathic) with ulcer and inflammation of bilateral lower extremity: Secondary | ICD-10-CM | POA: Diagnosis not present

## 2023-09-28 DIAGNOSIS — L97812 Non-pressure chronic ulcer of other part of right lower leg with fat layer exposed: Secondary | ICD-10-CM | POA: Diagnosis not present

## 2023-09-28 DIAGNOSIS — L98492 Non-pressure chronic ulcer of skin of other sites with fat layer exposed: Secondary | ICD-10-CM | POA: Diagnosis not present

## 2023-09-28 DIAGNOSIS — L97212 Non-pressure chronic ulcer of right calf with fat layer exposed: Secondary | ICD-10-CM | POA: Diagnosis not present

## 2023-09-28 DIAGNOSIS — L97222 Non-pressure chronic ulcer of left calf with fat layer exposed: Secondary | ICD-10-CM | POA: Diagnosis not present

## 2023-09-29 DIAGNOSIS — Z556 Problems related to health literacy: Secondary | ICD-10-CM | POA: Diagnosis not present

## 2023-09-29 DIAGNOSIS — N400 Enlarged prostate without lower urinary tract symptoms: Secondary | ICD-10-CM | POA: Diagnosis not present

## 2023-09-29 DIAGNOSIS — K219 Gastro-esophageal reflux disease without esophagitis: Secondary | ICD-10-CM | POA: Diagnosis not present

## 2023-09-29 DIAGNOSIS — I4892 Unspecified atrial flutter: Secondary | ICD-10-CM | POA: Diagnosis not present

## 2023-09-29 DIAGNOSIS — I87333 Chronic venous hypertension (idiopathic) with ulcer and inflammation of bilateral lower extremity: Secondary | ICD-10-CM | POA: Diagnosis not present

## 2023-09-29 DIAGNOSIS — L97812 Non-pressure chronic ulcer of other part of right lower leg with fat layer exposed: Secondary | ICD-10-CM | POA: Diagnosis not present

## 2023-09-29 DIAGNOSIS — Z7901 Long term (current) use of anticoagulants: Secondary | ICD-10-CM | POA: Diagnosis not present

## 2023-09-29 DIAGNOSIS — G629 Polyneuropathy, unspecified: Secondary | ICD-10-CM | POA: Diagnosis not present

## 2023-09-29 DIAGNOSIS — Z87891 Personal history of nicotine dependence: Secondary | ICD-10-CM | POA: Diagnosis not present

## 2023-09-29 DIAGNOSIS — L97822 Non-pressure chronic ulcer of other part of left lower leg with fat layer exposed: Secondary | ICD-10-CM | POA: Diagnosis not present

## 2023-09-29 DIAGNOSIS — Z85828 Personal history of other malignant neoplasm of skin: Secondary | ICD-10-CM | POA: Diagnosis not present

## 2023-09-29 DIAGNOSIS — M4800 Spinal stenosis, site unspecified: Secondary | ICD-10-CM | POA: Diagnosis not present

## 2023-09-29 DIAGNOSIS — I10 Essential (primary) hypertension: Secondary | ICD-10-CM | POA: Diagnosis not present

## 2023-09-29 DIAGNOSIS — M1990 Unspecified osteoarthritis, unspecified site: Secondary | ICD-10-CM | POA: Diagnosis not present

## 2023-09-29 DIAGNOSIS — I4891 Unspecified atrial fibrillation: Secondary | ICD-10-CM | POA: Diagnosis not present

## 2023-10-01 DIAGNOSIS — Z23 Encounter for immunization: Secondary | ICD-10-CM | POA: Diagnosis not present

## 2023-10-05 ENCOUNTER — Encounter (HOSPITAL_BASED_OUTPATIENT_CLINIC_OR_DEPARTMENT_OTHER): Admitting: General Surgery

## 2023-10-05 DIAGNOSIS — Z85828 Personal history of other malignant neoplasm of skin: Secondary | ICD-10-CM | POA: Diagnosis not present

## 2023-10-05 DIAGNOSIS — L97212 Non-pressure chronic ulcer of right calf with fat layer exposed: Secondary | ICD-10-CM | POA: Diagnosis not present

## 2023-10-05 DIAGNOSIS — I87333 Chronic venous hypertension (idiopathic) with ulcer and inflammation of bilateral lower extremity: Secondary | ICD-10-CM | POA: Diagnosis not present

## 2023-10-05 DIAGNOSIS — L98492 Non-pressure chronic ulcer of skin of other sites with fat layer exposed: Secondary | ICD-10-CM | POA: Diagnosis not present

## 2023-10-05 DIAGNOSIS — L97222 Non-pressure chronic ulcer of left calf with fat layer exposed: Secondary | ICD-10-CM | POA: Diagnosis not present

## 2023-10-05 DIAGNOSIS — L97219 Non-pressure chronic ulcer of right calf with unspecified severity: Secondary | ICD-10-CM | POA: Diagnosis not present

## 2023-10-05 DIAGNOSIS — L97322 Non-pressure chronic ulcer of left ankle with fat layer exposed: Secondary | ICD-10-CM | POA: Diagnosis not present

## 2023-10-05 DIAGNOSIS — L97229 Non-pressure chronic ulcer of left calf with unspecified severity: Secondary | ICD-10-CM | POA: Diagnosis not present

## 2023-10-11 DIAGNOSIS — R067 Sneezing: Secondary | ICD-10-CM | POA: Diagnosis not present

## 2023-10-11 DIAGNOSIS — I4891 Unspecified atrial fibrillation: Secondary | ICD-10-CM | POA: Diagnosis not present

## 2023-10-11 DIAGNOSIS — Z1152 Encounter for screening for COVID-19: Secondary | ICD-10-CM | POA: Diagnosis not present

## 2023-10-11 DIAGNOSIS — R5383 Other fatigue: Secondary | ICD-10-CM | POA: Diagnosis not present

## 2023-10-11 DIAGNOSIS — R062 Wheezing: Secondary | ICD-10-CM | POA: Diagnosis not present

## 2023-10-11 DIAGNOSIS — I10 Essential (primary) hypertension: Secondary | ICD-10-CM | POA: Diagnosis not present

## 2023-10-11 DIAGNOSIS — R058 Other specified cough: Secondary | ICD-10-CM | POA: Diagnosis not present

## 2023-10-11 DIAGNOSIS — I87333 Chronic venous hypertension (idiopathic) with ulcer and inflammation of bilateral lower extremity: Secondary | ICD-10-CM | POA: Diagnosis not present

## 2023-10-11 DIAGNOSIS — R0981 Nasal congestion: Secondary | ICD-10-CM | POA: Diagnosis not present

## 2023-10-11 DIAGNOSIS — I4892 Unspecified atrial flutter: Secondary | ICD-10-CM | POA: Diagnosis not present

## 2023-10-11 DIAGNOSIS — L97812 Non-pressure chronic ulcer of other part of right lower leg with fat layer exposed: Secondary | ICD-10-CM | POA: Diagnosis not present

## 2023-10-11 DIAGNOSIS — J209 Acute bronchitis, unspecified: Secondary | ICD-10-CM | POA: Diagnosis not present

## 2023-10-11 DIAGNOSIS — L97822 Non-pressure chronic ulcer of other part of left lower leg with fat layer exposed: Secondary | ICD-10-CM | POA: Diagnosis not present

## 2023-10-17 ENCOUNTER — Encounter (HOSPITAL_BASED_OUTPATIENT_CLINIC_OR_DEPARTMENT_OTHER): Attending: General Surgery | Admitting: General Surgery

## 2023-10-17 DIAGNOSIS — L97218 Non-pressure chronic ulcer of right calf with other specified severity: Secondary | ICD-10-CM | POA: Diagnosis not present

## 2023-10-17 DIAGNOSIS — L97222 Non-pressure chronic ulcer of left calf with fat layer exposed: Secondary | ICD-10-CM | POA: Diagnosis not present

## 2023-10-17 DIAGNOSIS — Z85828 Personal history of other malignant neoplasm of skin: Secondary | ICD-10-CM | POA: Insufficient documentation

## 2023-10-17 DIAGNOSIS — L97212 Non-pressure chronic ulcer of right calf with fat layer exposed: Secondary | ICD-10-CM | POA: Diagnosis not present

## 2023-10-17 DIAGNOSIS — L97228 Non-pressure chronic ulcer of left calf with other specified severity: Secondary | ICD-10-CM | POA: Insufficient documentation

## 2023-10-17 DIAGNOSIS — L97512 Non-pressure chronic ulcer of other part of right foot with fat layer exposed: Secondary | ICD-10-CM | POA: Diagnosis not present

## 2023-10-17 DIAGNOSIS — L98492 Non-pressure chronic ulcer of skin of other sites with fat layer exposed: Secondary | ICD-10-CM | POA: Diagnosis not present

## 2023-10-17 DIAGNOSIS — I87333 Chronic venous hypertension (idiopathic) with ulcer and inflammation of bilateral lower extremity: Secondary | ICD-10-CM | POA: Diagnosis not present

## 2023-10-17 DIAGNOSIS — T8189XA Other complications of procedures, not elsewhere classified, initial encounter: Secondary | ICD-10-CM | POA: Diagnosis not present

## 2023-10-25 DIAGNOSIS — I4892 Unspecified atrial flutter: Secondary | ICD-10-CM | POA: Diagnosis not present

## 2023-10-25 DIAGNOSIS — L97812 Non-pressure chronic ulcer of other part of right lower leg with fat layer exposed: Secondary | ICD-10-CM | POA: Diagnosis not present

## 2023-10-25 DIAGNOSIS — I4891 Unspecified atrial fibrillation: Secondary | ICD-10-CM | POA: Diagnosis not present

## 2023-10-25 DIAGNOSIS — L97822 Non-pressure chronic ulcer of other part of left lower leg with fat layer exposed: Secondary | ICD-10-CM | POA: Diagnosis not present

## 2023-10-25 DIAGNOSIS — I87333 Chronic venous hypertension (idiopathic) with ulcer and inflammation of bilateral lower extremity: Secondary | ICD-10-CM | POA: Diagnosis not present

## 2023-10-25 DIAGNOSIS — I10 Essential (primary) hypertension: Secondary | ICD-10-CM | POA: Diagnosis not present

## 2023-10-26 DIAGNOSIS — S61402A Unspecified open wound of left hand, initial encounter: Secondary | ICD-10-CM | POA: Diagnosis not present

## 2023-10-29 DIAGNOSIS — I4891 Unspecified atrial fibrillation: Secondary | ICD-10-CM | POA: Diagnosis not present

## 2023-10-29 DIAGNOSIS — Z556 Problems related to health literacy: Secondary | ICD-10-CM | POA: Diagnosis not present

## 2023-10-29 DIAGNOSIS — G629 Polyneuropathy, unspecified: Secondary | ICD-10-CM | POA: Diagnosis not present

## 2023-10-29 DIAGNOSIS — M1990 Unspecified osteoarthritis, unspecified site: Secondary | ICD-10-CM | POA: Diagnosis not present

## 2023-10-29 DIAGNOSIS — Z85828 Personal history of other malignant neoplasm of skin: Secondary | ICD-10-CM | POA: Diagnosis not present

## 2023-10-29 DIAGNOSIS — Z87891 Personal history of nicotine dependence: Secondary | ICD-10-CM | POA: Diagnosis not present

## 2023-10-29 DIAGNOSIS — I10 Essential (primary) hypertension: Secondary | ICD-10-CM | POA: Diagnosis not present

## 2023-10-29 DIAGNOSIS — Z7901 Long term (current) use of anticoagulants: Secondary | ICD-10-CM | POA: Diagnosis not present

## 2023-10-29 DIAGNOSIS — K219 Gastro-esophageal reflux disease without esophagitis: Secondary | ICD-10-CM | POA: Diagnosis not present

## 2023-10-29 DIAGNOSIS — M4800 Spinal stenosis, site unspecified: Secondary | ICD-10-CM | POA: Diagnosis not present

## 2023-10-29 DIAGNOSIS — L97822 Non-pressure chronic ulcer of other part of left lower leg with fat layer exposed: Secondary | ICD-10-CM | POA: Diagnosis not present

## 2023-10-29 DIAGNOSIS — I87333 Chronic venous hypertension (idiopathic) with ulcer and inflammation of bilateral lower extremity: Secondary | ICD-10-CM | POA: Diagnosis not present

## 2023-10-29 DIAGNOSIS — N400 Enlarged prostate without lower urinary tract symptoms: Secondary | ICD-10-CM | POA: Diagnosis not present

## 2023-10-29 DIAGNOSIS — L97812 Non-pressure chronic ulcer of other part of right lower leg with fat layer exposed: Secondary | ICD-10-CM | POA: Diagnosis not present

## 2023-10-29 DIAGNOSIS — I4892 Unspecified atrial flutter: Secondary | ICD-10-CM | POA: Diagnosis not present

## 2023-11-01 ENCOUNTER — Encounter (HOSPITAL_BASED_OUTPATIENT_CLINIC_OR_DEPARTMENT_OTHER): Admitting: General Surgery

## 2023-11-01 DIAGNOSIS — L98492 Non-pressure chronic ulcer of skin of other sites with fat layer exposed: Secondary | ICD-10-CM | POA: Diagnosis not present

## 2023-11-01 DIAGNOSIS — I87333 Chronic venous hypertension (idiopathic) with ulcer and inflammation of bilateral lower extremity: Secondary | ICD-10-CM | POA: Diagnosis not present

## 2023-11-01 DIAGNOSIS — Z85828 Personal history of other malignant neoplasm of skin: Secondary | ICD-10-CM | POA: Diagnosis not present

## 2023-11-01 DIAGNOSIS — L97322 Non-pressure chronic ulcer of left ankle with fat layer exposed: Secondary | ICD-10-CM | POA: Diagnosis not present

## 2023-11-01 DIAGNOSIS — L97228 Non-pressure chronic ulcer of left calf with other specified severity: Secondary | ICD-10-CM | POA: Diagnosis not present

## 2023-11-01 DIAGNOSIS — L97512 Non-pressure chronic ulcer of other part of right foot with fat layer exposed: Secondary | ICD-10-CM | POA: Diagnosis not present

## 2023-11-01 DIAGNOSIS — L97222 Non-pressure chronic ulcer of left calf with fat layer exposed: Secondary | ICD-10-CM | POA: Diagnosis not present

## 2023-11-01 DIAGNOSIS — L97218 Non-pressure chronic ulcer of right calf with other specified severity: Secondary | ICD-10-CM | POA: Diagnosis not present

## 2023-11-01 DIAGNOSIS — L97212 Non-pressure chronic ulcer of right calf with fat layer exposed: Secondary | ICD-10-CM | POA: Diagnosis not present

## 2023-11-03 ENCOUNTER — Encounter (HOSPITAL_BASED_OUTPATIENT_CLINIC_OR_DEPARTMENT_OTHER): Admitting: General Surgery

## 2023-11-07 DIAGNOSIS — I10 Essential (primary) hypertension: Secondary | ICD-10-CM | POA: Diagnosis not present

## 2023-11-07 DIAGNOSIS — I4891 Unspecified atrial fibrillation: Secondary | ICD-10-CM | POA: Diagnosis not present

## 2023-11-07 DIAGNOSIS — L97812 Non-pressure chronic ulcer of other part of right lower leg with fat layer exposed: Secondary | ICD-10-CM | POA: Diagnosis not present

## 2023-11-07 DIAGNOSIS — L97822 Non-pressure chronic ulcer of other part of left lower leg with fat layer exposed: Secondary | ICD-10-CM | POA: Diagnosis not present

## 2023-11-07 DIAGNOSIS — I87333 Chronic venous hypertension (idiopathic) with ulcer and inflammation of bilateral lower extremity: Secondary | ICD-10-CM | POA: Diagnosis not present

## 2023-11-07 DIAGNOSIS — I4892 Unspecified atrial flutter: Secondary | ICD-10-CM | POA: Diagnosis not present

## 2023-11-09 DIAGNOSIS — N401 Enlarged prostate with lower urinary tract symptoms: Secondary | ICD-10-CM | POA: Diagnosis not present

## 2023-11-09 DIAGNOSIS — I10 Essential (primary) hypertension: Secondary | ICD-10-CM | POA: Diagnosis not present

## 2023-11-09 DIAGNOSIS — E669 Obesity, unspecified: Secondary | ICD-10-CM | POA: Diagnosis not present

## 2023-11-09 DIAGNOSIS — M1990 Unspecified osteoarthritis, unspecified site: Secondary | ICD-10-CM | POA: Diagnosis not present

## 2023-11-09 DIAGNOSIS — Z7901 Long term (current) use of anticoagulants: Secondary | ICD-10-CM | POA: Diagnosis not present

## 2023-11-09 DIAGNOSIS — R7302 Impaired glucose tolerance (oral): Secondary | ICD-10-CM | POA: Diagnosis not present

## 2023-11-09 DIAGNOSIS — I480 Paroxysmal atrial fibrillation: Secondary | ICD-10-CM | POA: Diagnosis not present

## 2023-11-09 DIAGNOSIS — E785 Hyperlipidemia, unspecified: Secondary | ICD-10-CM | POA: Diagnosis not present

## 2023-11-15 ENCOUNTER — Encounter (HOSPITAL_BASED_OUTPATIENT_CLINIC_OR_DEPARTMENT_OTHER): Attending: General Surgery | Admitting: General Surgery

## 2023-11-15 DIAGNOSIS — I87331 Chronic venous hypertension (idiopathic) with ulcer and inflammation of right lower extremity: Secondary | ICD-10-CM | POA: Diagnosis not present

## 2023-11-15 DIAGNOSIS — L97512 Non-pressure chronic ulcer of other part of right foot with fat layer exposed: Secondary | ICD-10-CM | POA: Diagnosis not present

## 2023-11-15 DIAGNOSIS — L98492 Non-pressure chronic ulcer of skin of other sites with fat layer exposed: Secondary | ICD-10-CM | POA: Diagnosis not present

## 2023-11-15 DIAGNOSIS — I87333 Chronic venous hypertension (idiopathic) with ulcer and inflammation of bilateral lower extremity: Secondary | ICD-10-CM | POA: Insufficient documentation

## 2023-11-15 DIAGNOSIS — Z85828 Personal history of other malignant neoplasm of skin: Secondary | ICD-10-CM | POA: Diagnosis not present

## 2023-11-15 DIAGNOSIS — L97212 Non-pressure chronic ulcer of right calf with fat layer exposed: Secondary | ICD-10-CM | POA: Diagnosis not present

## 2023-11-15 DIAGNOSIS — T8189XA Other complications of procedures, not elsewhere classified, initial encounter: Secondary | ICD-10-CM | POA: Diagnosis not present

## 2023-11-23 DIAGNOSIS — L97812 Non-pressure chronic ulcer of other part of right lower leg with fat layer exposed: Secondary | ICD-10-CM | POA: Diagnosis not present

## 2023-11-23 DIAGNOSIS — I10 Essential (primary) hypertension: Secondary | ICD-10-CM | POA: Diagnosis not present

## 2023-11-23 DIAGNOSIS — I87333 Chronic venous hypertension (idiopathic) with ulcer and inflammation of bilateral lower extremity: Secondary | ICD-10-CM | POA: Diagnosis not present

## 2023-11-23 DIAGNOSIS — I4892 Unspecified atrial flutter: Secondary | ICD-10-CM | POA: Diagnosis not present

## 2023-11-23 DIAGNOSIS — L97822 Non-pressure chronic ulcer of other part of left lower leg with fat layer exposed: Secondary | ICD-10-CM | POA: Diagnosis not present

## 2023-11-23 DIAGNOSIS — I4891 Unspecified atrial fibrillation: Secondary | ICD-10-CM | POA: Diagnosis not present

## 2023-11-29 ENCOUNTER — Encounter (HOSPITAL_BASED_OUTPATIENT_CLINIC_OR_DEPARTMENT_OTHER): Admitting: General Surgery

## 2023-11-29 DIAGNOSIS — L97512 Non-pressure chronic ulcer of other part of right foot with fat layer exposed: Secondary | ICD-10-CM | POA: Diagnosis not present

## 2023-11-29 DIAGNOSIS — L089 Local infection of the skin and subcutaneous tissue, unspecified: Secondary | ICD-10-CM | POA: Diagnosis not present

## 2023-11-29 DIAGNOSIS — Z85828 Personal history of other malignant neoplasm of skin: Secondary | ICD-10-CM | POA: Diagnosis not present

## 2023-11-29 DIAGNOSIS — L98492 Non-pressure chronic ulcer of skin of other sites with fat layer exposed: Secondary | ICD-10-CM | POA: Diagnosis not present

## 2023-11-29 DIAGNOSIS — I87333 Chronic venous hypertension (idiopathic) with ulcer and inflammation of bilateral lower extremity: Secondary | ICD-10-CM | POA: Diagnosis not present

## 2023-11-29 DIAGNOSIS — T8189XA Other complications of procedures, not elsewhere classified, initial encounter: Secondary | ICD-10-CM | POA: Diagnosis not present

## 2023-12-13 ENCOUNTER — Encounter (HOSPITAL_BASED_OUTPATIENT_CLINIC_OR_DEPARTMENT_OTHER): Admitting: General Surgery

## 2023-12-13 DIAGNOSIS — I87333 Chronic venous hypertension (idiopathic) with ulcer and inflammation of bilateral lower extremity: Secondary | ICD-10-CM | POA: Diagnosis not present

## 2023-12-13 DIAGNOSIS — L98492 Non-pressure chronic ulcer of skin of other sites with fat layer exposed: Secondary | ICD-10-CM | POA: Diagnosis not present

## 2023-12-13 DIAGNOSIS — T8189XA Other complications of procedures, not elsewhere classified, initial encounter: Secondary | ICD-10-CM | POA: Diagnosis not present

## 2023-12-13 DIAGNOSIS — Z85828 Personal history of other malignant neoplasm of skin: Secondary | ICD-10-CM | POA: Diagnosis not present

## 2023-12-13 DIAGNOSIS — L97512 Non-pressure chronic ulcer of other part of right foot with fat layer exposed: Secondary | ICD-10-CM | POA: Diagnosis not present

## 2023-12-27 ENCOUNTER — Encounter (HOSPITAL_BASED_OUTPATIENT_CLINIC_OR_DEPARTMENT_OTHER): Attending: General Surgery | Admitting: General Surgery

## 2023-12-27 DIAGNOSIS — L089 Local infection of the skin and subcutaneous tissue, unspecified: Secondary | ICD-10-CM | POA: Diagnosis not present

## 2023-12-27 DIAGNOSIS — I87333 Chronic venous hypertension (idiopathic) with ulcer and inflammation of bilateral lower extremity: Secondary | ICD-10-CM | POA: Diagnosis not present

## 2023-12-27 DIAGNOSIS — Z85828 Personal history of other malignant neoplasm of skin: Secondary | ICD-10-CM | POA: Diagnosis not present

## 2023-12-27 DIAGNOSIS — L97512 Non-pressure chronic ulcer of other part of right foot with fat layer exposed: Secondary | ICD-10-CM | POA: Insufficient documentation

## 2023-12-29 DIAGNOSIS — N400 Enlarged prostate without lower urinary tract symptoms: Secondary | ICD-10-CM | POA: Diagnosis not present

## 2023-12-29 DIAGNOSIS — R310 Gross hematuria: Secondary | ICD-10-CM | POA: Diagnosis not present

## 2024-01-10 ENCOUNTER — Encounter (HOSPITAL_BASED_OUTPATIENT_CLINIC_OR_DEPARTMENT_OTHER): Admitting: General Surgery

## 2024-01-10 DIAGNOSIS — L97512 Non-pressure chronic ulcer of other part of right foot with fat layer exposed: Secondary | ICD-10-CM | POA: Diagnosis not present

## 2024-01-10 DIAGNOSIS — L089 Local infection of the skin and subcutaneous tissue, unspecified: Secondary | ICD-10-CM | POA: Diagnosis not present

## 2024-01-10 DIAGNOSIS — Z85828 Personal history of other malignant neoplasm of skin: Secondary | ICD-10-CM | POA: Diagnosis not present

## 2024-01-10 DIAGNOSIS — I87333 Chronic venous hypertension (idiopathic) with ulcer and inflammation of bilateral lower extremity: Secondary | ICD-10-CM | POA: Diagnosis not present

## 2024-01-24 ENCOUNTER — Encounter (HOSPITAL_BASED_OUTPATIENT_CLINIC_OR_DEPARTMENT_OTHER): Attending: General Surgery | Admitting: General Surgery

## 2024-01-24 DIAGNOSIS — I87333 Chronic venous hypertension (idiopathic) with ulcer and inflammation of bilateral lower extremity: Secondary | ICD-10-CM | POA: Insufficient documentation

## 2024-01-24 DIAGNOSIS — L97512 Non-pressure chronic ulcer of other part of right foot with fat layer exposed: Secondary | ICD-10-CM | POA: Insufficient documentation

## 2024-01-24 DIAGNOSIS — Z85828 Personal history of other malignant neoplasm of skin: Secondary | ICD-10-CM | POA: Insufficient documentation

## 2024-01-24 DIAGNOSIS — L089 Local infection of the skin and subcutaneous tissue, unspecified: Secondary | ICD-10-CM | POA: Diagnosis not present

## 2024-01-28 DIAGNOSIS — Z23 Encounter for immunization: Secondary | ICD-10-CM | POA: Diagnosis not present

## 2024-02-13 DIAGNOSIS — H40013 Open angle with borderline findings, low risk, bilateral: Secondary | ICD-10-CM | POA: Diagnosis not present

## 2024-02-13 DIAGNOSIS — H35363 Drusen (degenerative) of macula, bilateral: Secondary | ICD-10-CM | POA: Diagnosis not present

## 2024-02-13 DIAGNOSIS — H43813 Vitreous degeneration, bilateral: Secondary | ICD-10-CM | POA: Diagnosis not present

## 2024-02-28 DIAGNOSIS — M5442 Lumbago with sciatica, left side: Secondary | ICD-10-CM | POA: Diagnosis not present

## 2024-02-28 DIAGNOSIS — M7062 Trochanteric bursitis, left hip: Secondary | ICD-10-CM | POA: Diagnosis not present

## 2024-03-05 DIAGNOSIS — L821 Other seborrheic keratosis: Secondary | ICD-10-CM | POA: Diagnosis not present

## 2024-03-05 DIAGNOSIS — L2989 Other pruritus: Secondary | ICD-10-CM | POA: Diagnosis not present

## 2024-03-05 DIAGNOSIS — L814 Other melanin hyperpigmentation: Secondary | ICD-10-CM | POA: Diagnosis not present

## 2024-03-05 DIAGNOSIS — L820 Inflamed seborrheic keratosis: Secondary | ICD-10-CM | POA: Diagnosis not present

## 2024-03-05 DIAGNOSIS — D1801 Hemangioma of skin and subcutaneous tissue: Secondary | ICD-10-CM | POA: Diagnosis not present

## 2024-03-05 DIAGNOSIS — Z789 Other specified health status: Secondary | ICD-10-CM | POA: Diagnosis not present

## 2024-03-05 DIAGNOSIS — L538 Other specified erythematous conditions: Secondary | ICD-10-CM | POA: Diagnosis not present

## 2024-03-05 DIAGNOSIS — L570 Actinic keratosis: Secondary | ICD-10-CM | POA: Diagnosis not present

## 2024-03-19 DIAGNOSIS — H01001 Unspecified blepharitis right upper eyelid: Secondary | ICD-10-CM | POA: Diagnosis not present

## 2024-03-19 DIAGNOSIS — H01004 Unspecified blepharitis left upper eyelid: Secondary | ICD-10-CM | POA: Diagnosis not present

## 2024-03-19 DIAGNOSIS — H02135 Senile ectropion of left lower eyelid: Secondary | ICD-10-CM | POA: Diagnosis not present

## 2024-03-19 DIAGNOSIS — H02201 Unspecified lagophthalmos right upper eyelid: Secondary | ICD-10-CM | POA: Diagnosis not present

## 2024-03-21 DIAGNOSIS — I10 Essential (primary) hypertension: Secondary | ICD-10-CM | POA: Diagnosis not present

## 2024-03-21 DIAGNOSIS — E785 Hyperlipidemia, unspecified: Secondary | ICD-10-CM | POA: Diagnosis not present

## 2024-03-21 DIAGNOSIS — N401 Enlarged prostate with lower urinary tract symptoms: Secondary | ICD-10-CM | POA: Diagnosis not present

## 2024-03-21 DIAGNOSIS — Z7901 Long term (current) use of anticoagulants: Secondary | ICD-10-CM | POA: Diagnosis not present

## 2024-03-21 DIAGNOSIS — R7302 Impaired glucose tolerance (oral): Secondary | ICD-10-CM | POA: Diagnosis not present

## 2024-04-02 DIAGNOSIS — R82998 Other abnormal findings in urine: Secondary | ICD-10-CM | POA: Diagnosis not present

## 2024-04-17 ENCOUNTER — Ambulatory Visit: Admitting: Orthopedic Surgery

## 2024-04-17 ENCOUNTER — Encounter: Payer: Self-pay | Admitting: Orthopedic Surgery

## 2024-04-17 ENCOUNTER — Ambulatory Visit (HOSPITAL_COMMUNITY)
Admission: RE | Admit: 2024-04-17 | Discharge: 2024-04-17 | Disposition: A | Source: Ambulatory Visit | Attending: Orthopedic Surgery | Admitting: Orthopedic Surgery

## 2024-04-17 DIAGNOSIS — I87333 Chronic venous hypertension (idiopathic) with ulcer and inflammation of bilateral lower extremity: Secondary | ICD-10-CM | POA: Diagnosis not present

## 2024-04-17 DIAGNOSIS — I739 Peripheral vascular disease, unspecified: Secondary | ICD-10-CM | POA: Diagnosis not present

## 2024-04-17 DIAGNOSIS — L97509 Non-pressure chronic ulcer of other part of unspecified foot with unspecified severity: Secondary | ICD-10-CM

## 2024-04-17 LAB — VAS US ABI WITH/WO TBI
Left ABI: 0.97
Right ABI: 0.96
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
## 2024-04-18 ENCOUNTER — Ambulatory Visit: Attending: Vascular Surgery | Admitting: Vascular Surgery

## 2024-04-18 ENCOUNTER — Encounter: Payer: Self-pay | Admitting: Vascular Surgery

## 2024-04-18 VITALS — BP 150/81 | HR 59 | Temp 98.0°F

## 2024-04-18 DIAGNOSIS — I739 Peripheral vascular disease, unspecified: Secondary | ICD-10-CM | POA: Diagnosis not present
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
## 2024-04-24 ENCOUNTER — Ambulatory Visit: Admitting: Orthopedic Surgery

## 2024-04-24 DIAGNOSIS — I739 Peripheral vascular disease, unspecified: Secondary | ICD-10-CM | POA: Diagnosis not present

## 2024-04-24 DIAGNOSIS — I87333 Chronic venous hypertension (idiopathic) with ulcer and inflammation of bilateral lower extremity: Secondary | ICD-10-CM | POA: Diagnosis not present

## 2024-04-26 ENCOUNTER — Encounter: Payer: Self-pay | Admitting: Orthopedic Surgery
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
## 2024-05-01 ENCOUNTER — Ambulatory Visit: Admitting: Orthopedic Surgery

## 2024-05-01 DIAGNOSIS — I739 Peripheral vascular disease, unspecified: Secondary | ICD-10-CM | POA: Diagnosis not present

## 2024-05-01 DIAGNOSIS — I87333 Chronic venous hypertension (idiopathic) with ulcer and inflammation of bilateral lower extremity: Secondary | ICD-10-CM | POA: Diagnosis not present

## 2024-05-02 ENCOUNTER — Encounter: Payer: Self-pay | Admitting: Orthopedic Surgery
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
## 2024-05-07 ENCOUNTER — Encounter: Payer: Self-pay | Admitting: Orthopedic Surgery

## 2024-05-07 ENCOUNTER — Ambulatory Visit (INDEPENDENT_AMBULATORY_CARE_PROVIDER_SITE_OTHER): Admitting: Orthopedic Surgery

## 2024-05-07 DIAGNOSIS — I739 Peripheral vascular disease, unspecified: Secondary | ICD-10-CM | POA: Diagnosis not present

## 2024-05-07 DIAGNOSIS — I87333 Chronic venous hypertension (idiopathic) with ulcer and inflammation of bilateral lower extremity: Secondary | ICD-10-CM | POA: Diagnosis not present
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
## 2024-05-22 ENCOUNTER — Ambulatory Visit (INDEPENDENT_AMBULATORY_CARE_PROVIDER_SITE_OTHER): Admitting: Orthopedic Surgery

## 2024-05-22 DIAGNOSIS — I87333 Chronic venous hypertension (idiopathic) with ulcer and inflammation of bilateral lower extremity: Secondary | ICD-10-CM | POA: Diagnosis not present

## 2024-05-22 DIAGNOSIS — I739 Peripheral vascular disease, unspecified: Secondary | ICD-10-CM

## 2024-05-22 DIAGNOSIS — L97509 Non-pressure chronic ulcer of other part of unspecified foot with unspecified severity: Secondary | ICD-10-CM | POA: Diagnosis not present

## 2024-05-23 ENCOUNTER — Encounter: Payer: Self-pay | Admitting: Orthopedic Surgery
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
